# Patient Record
Sex: Female | Born: 1945 | Race: Black or African American | Hispanic: No | Marital: Married | Smoking: Never smoker
Health system: Southern US, Community
[De-identification: ages and names within clinical notes are randomized; demographics above are authoritative.]

## PROBLEM LIST (undated history)

## (undated) DIAGNOSIS — E079 Disorder of thyroid, unspecified: Secondary | ICD-10-CM

## (undated) DIAGNOSIS — G309 Alzheimer's disease, unspecified: Secondary | ICD-10-CM

## (undated) DIAGNOSIS — E119 Type 2 diabetes mellitus without complications: Secondary | ICD-10-CM

## (undated) DIAGNOSIS — F0280 Dementia in other diseases classified elsewhere without behavioral disturbance: Secondary | ICD-10-CM

## (undated) DIAGNOSIS — I10 Essential (primary) hypertension: Secondary | ICD-10-CM

## (undated) DIAGNOSIS — F410 Panic disorder [episodic paroxysmal anxiety] without agoraphobia: Secondary | ICD-10-CM

## (undated) HISTORY — PX: THYROIDECTOMY: SHX17

## (undated) HISTORY — PX: CHOLECYSTECTOMY: SHX55

---

## 2008-10-28 ENCOUNTER — Emergency Department (HOSPITAL_BASED_OUTPATIENT_CLINIC_OR_DEPARTMENT_OTHER): Admission: EM | Admit: 2008-10-28 | Discharge: 2008-10-29 | Payer: Self-pay | Admitting: Emergency Medicine

## 2008-10-29 ENCOUNTER — Ambulatory Visit: Payer: Self-pay | Admitting: Diagnostic Radiology

## 2010-06-14 LAB — BASIC METABOLIC PANEL
BUN: 13 mg/dL (ref 6–23)
CO2: 30 mEq/L (ref 19–32)
Chloride: 102 mEq/L (ref 96–112)
Potassium: 4 mEq/L (ref 3.5–5.1)

## 2010-06-14 LAB — D-DIMER, QUANTITATIVE: D-Dimer, Quant: <0.22 ug/mL-FEU (ref 0.00–0.48)

## 2012-10-08 ENCOUNTER — Emergency Department (HOSPITAL_BASED_OUTPATIENT_CLINIC_OR_DEPARTMENT_OTHER)
Admission: EM | Admit: 2012-10-08 | Discharge: 2012-10-08 | Disposition: A | Discharge status: Home / Self Care | Payer: Medicare PPO | Attending: Emergency Medicine | Admitting: Emergency Medicine

## 2012-10-08 ENCOUNTER — Encounter (HOSPITAL_BASED_OUTPATIENT_CLINIC_OR_DEPARTMENT_OTHER): Payer: Self-pay | Admitting: *Deleted

## 2012-10-08 ENCOUNTER — Emergency Department (HOSPITAL_BASED_OUTPATIENT_CLINIC_OR_DEPARTMENT_OTHER): Payer: Medicare PPO

## 2012-10-08 DIAGNOSIS — R0789 Other chest pain: Secondary | ICD-10-CM

## 2012-10-08 DIAGNOSIS — Z880 Allergy status to penicillin: Secondary | ICD-10-CM | POA: Insufficient documentation

## 2012-10-08 DIAGNOSIS — I10 Essential (primary) hypertension: Secondary | ICD-10-CM | POA: Insufficient documentation

## 2012-10-08 DIAGNOSIS — Z7982 Long term (current) use of aspirin: Secondary | ICD-10-CM | POA: Insufficient documentation

## 2012-10-08 DIAGNOSIS — R071 Chest pain on breathing: Secondary | ICD-10-CM | POA: Insufficient documentation

## 2012-10-08 DIAGNOSIS — E119 Type 2 diabetes mellitus without complications: Secondary | ICD-10-CM | POA: Insufficient documentation

## 2012-10-08 DIAGNOSIS — E079 Disorder of thyroid, unspecified: Secondary | ICD-10-CM | POA: Insufficient documentation

## 2012-10-08 DIAGNOSIS — Z79899 Other long term (current) drug therapy: Secondary | ICD-10-CM | POA: Insufficient documentation

## 2012-10-08 HISTORY — DX: Type 2 diabetes mellitus without complications: E11.9

## 2012-10-08 HISTORY — DX: Essential (primary) hypertension: I10

## 2012-10-08 HISTORY — DX: Disorder of thyroid, unspecified: E07.9

## 2012-10-08 NOTE — ED Provider Notes (Addendum)
CSN: [id number]     Arrival date & time 10/08/12  1836 History     First MD Initiated Contact with Patient 10/08/12 1844     Chief Complaint  Patient presents with  . Back Pain   (Consider location/radiation/quality/duration/timing/severity/associated sxs/prior Treatment) HPI Complains of midthoracic back pain radiating around to right chest to under right breast onset yesterday. Pain is worse with changing position or deep inspiration. No fever no cough pain gradual onset pain is improved with remaining still. No rash no fever no shortness of breath no other associated symptoms she treated herself with half of a hydrocodone-apap this afternoon, without relief. Pain is moderate at present. Past Medical History  Diagnosis Date  . Diabetes mellitus without complication   . Hypertension   . Thyroid disease    Past Surgical History  Procedure Laterality Date  . Thyroidectomy    . Cholecystectomy     No family history on file. History  Substance Use Topics  . Smoking status: Never Smoker   . Smokeless tobacco: Not on file  . Alcohol Use: No   OB History   Grav Para Term Preterm Abortions TAB SAB Ect Mult Living                 Review of Systems  Cardiovascular: Positive for chest pain.  Musculoskeletal: Positive for back pain.  All other systems reviewed and are negative.    Allergies  Celebrex; Lisinopril; Meloxicam; and Penicillins  Home Medications   Current Outpatient Rx  Name  Route  Sig  Dispense  Refill  . amLODipine (NORVASC) 10 MG tablet   Oral   Take 10 mg by mouth daily.         Marland Kitchen aspirin 81 MG tablet   Oral   Take 81 mg by mouth daily.         Marland Kitchen levothyroxine (SYNTHROID, LEVOTHROID) 137 MCG tablet   Oral   Take 137 mcg by mouth daily before breakfast.         . losartan (COZAAR) 25 MG tablet   Oral   Take 25 mg by mouth daily.         . metFORMIN (GLUCOPHAGE) 1000 MG tablet   Oral   Take 1,000 mg by mouth 2 (two) times daily with a  meal.         . Multiple Vitamin (MULTIVITAMIN) tablet   Oral   Take 1 tablet by mouth daily.          BP 145/81  Pulse 67  Temp(Src) 98.3 F (36.8 C) (Oral)  Resp 18  Wt 198 lb (89.812 kg)  SpO2 100% Physical Exam  Nursing note and vitals reviewed. Constitutional: She appears well-developed and well-nourished.  HENT:  Head: Normocephalic and atraumatic.  Eyes: Conjunctivae are normal. Pupils are equal, round, and reactive to light.  Neck: Neck supple. No tracheal deviation present. No thyromegaly present.  Cardiovascular: Normal rate and regular rhythm.   No murmur heard. Pulmonary/Chest: Effort normal and breath sounds normal.  Abdominal: Soft. Bowel sounds are normal. She exhibits no distension. There is no tenderness.  Musculoskeletal: Normal range of motion. She exhibits no edema and no tenderness.  Entire spine nontender. Pain is exacerbated when she sits up from a supine position or when she twists her torso to the left  Neurological: She is alert. Coordination normal.  Skin: Skin is warm and dry. No rash noted.  Psychiatric: She has a normal mood and affect.    ED Course  Procedures (including critical care time)  Labs Reviewed - No data to display No results found. No diagnosis found. Patient took 1 of her own Norco tablets here 7:32 PM  845 p.m. pain improved X-ray viewed by me Results for orders placed during the hospital encounter of 10/28/08  BASIC METABOLIC PANEL      Result Value Range   Sodium 140  135 - 145 mEq/L   Potassium 4.0  3.5 - 5.1 mEq/L   Chloride 102  96 - 112 mEq/L   CO2 30  19 - 32 mEq/L   Glucose, Bld 105 (*) 70 - 99 mg/dL   BUN 13  6 - 23 mg/dL   Creatinine, Ser .9  0.4 - 1.2 mg/dL   Calcium 9.1  8.4 - 78.2 mg/dL   GFR calc non Af Amer >60  >60 mL/min   GFR calc Af Amer    >60 mL/min   Value: >60            The eGFR has been calculated     using the MDRD equation.     This calculation has not been     validated in all  clinical     situations.     eGFR's persistently     <60 mL/min signify     possible Chronic Kidney Disease.  D-DIMER, QUANTITATIVE      Result Value Range   D-Dimer, Quant    0.00 - 0.48 ug/mL-FEU   Value: <0.22            AT THE INHOUSE ESTABLISHED CUTOFF     VALUE OF 0.48 ug/mL FEU,     THIS ASSAY HAS BEEN DOCUMENTED     IN THE LITERATURE TO HAVE     A SENSITIVITY AND NEGATIVE     PREDICTIVE VALUE OF AT LEAST     98 TO 99%.  THE TEST RESULT     SHOULD BE CORRELATED WITH     AN ASSESSMENT OF THE CLINICAL     PROBABILITY OF DVT / VTE.   Dg Chest 2 View  10/08/2012   *RADIOLOGY REPORT*  Clinical Data: Chronic upper back pain, right chest pain, shortness of breath  CHEST - 2 VIEW  Comparison: 10/29/2008  Findings: Lungs are essentially clear.  No focal consolidation.  No pleural effusion or pneumothorax.  Mild cardiomegaly.  Mild degenerative changes of the visualized thoracolumbar spine.  IMPRESSION: No evidence of acute cardiopulmonary disease.   Original Report Authenticated By: Charline Bills, M.D.    MDM  Pain felt to be musculoskeltal in etiology, strongly doubt pulmonary embolism i.e. no shortness of breath pain is worse with moving or changing position no shortness of breath  Plan she may increasehydrocodone -apap. 2 tablets every 6 hours as needed for pain Followup with Dr.Cabeza if continued to have significant pain in 3 or 4 days Diagnosis chest wall pain  .  Doug Sou, MD 10/08/12 [id number]  Doug Sou, MD 10/09/12 [phone]

## 2012-10-08 NOTE — ED Notes (Signed)
Pain under right shoulder that radiates to the right side, started yesterday. Denies N/V

## 2013-05-31 ENCOUNTER — Encounter (HOSPITAL_BASED_OUTPATIENT_CLINIC_OR_DEPARTMENT_OTHER): Payer: Self-pay | Admitting: Emergency Medicine

## 2013-05-31 ENCOUNTER — Emergency Department (HOSPITAL_BASED_OUTPATIENT_CLINIC_OR_DEPARTMENT_OTHER)
Admission: EM | Admit: 2013-05-31 | Discharge: 2013-05-31 | Disposition: A | Discharge status: Home / Self Care | Payer: Medicare PPO | Attending: Emergency Medicine | Admitting: Emergency Medicine

## 2013-05-31 ENCOUNTER — Emergency Department (HOSPITAL_BASED_OUTPATIENT_CLINIC_OR_DEPARTMENT_OTHER): Payer: Medicare PPO

## 2013-05-31 DIAGNOSIS — G8929 Other chronic pain: Secondary | ICD-10-CM | POA: Insufficient documentation

## 2013-05-31 DIAGNOSIS — M549 Dorsalgia, unspecified: Secondary | ICD-10-CM

## 2013-05-31 DIAGNOSIS — M5412 Radiculopathy, cervical region: Secondary | ICD-10-CM

## 2013-05-31 DIAGNOSIS — Z880 Allergy status to penicillin: Secondary | ICD-10-CM | POA: Insufficient documentation

## 2013-05-31 DIAGNOSIS — I10 Essential (primary) hypertension: Secondary | ICD-10-CM | POA: Insufficient documentation

## 2013-05-31 DIAGNOSIS — E079 Disorder of thyroid, unspecified: Secondary | ICD-10-CM | POA: Insufficient documentation

## 2013-05-31 DIAGNOSIS — E119 Type 2 diabetes mellitus without complications: Secondary | ICD-10-CM | POA: Insufficient documentation

## 2013-05-31 DIAGNOSIS — Z79899 Other long term (current) drug therapy: Secondary | ICD-10-CM | POA: Insufficient documentation

## 2013-05-31 DIAGNOSIS — Z7982 Long term (current) use of aspirin: Secondary | ICD-10-CM | POA: Insufficient documentation

## 2013-05-31 LAB — CBC WITH DIFFERENTIAL/PLATELET
BASOS ABS: 0.1 10*3/uL (ref 0.0–0.1)
BASOS PCT: 1 % (ref 0–1)
EOS ABS: 0.2 10*3/uL (ref 0.0–0.7)
Eosinophils Relative: 3 % (ref 0–5)
HCT: 34.9 % — ABNORMAL LOW (ref 36.0–46.0)
Hemoglobin: 11.4 g/dL — ABNORMAL LOW (ref 12.0–15.0)
LYMPHS ABS: 1.4 10*3/uL (ref 0.7–4.0)
Lymphocytes Relative: 25 % (ref 12–46)
MCH: 29.6 pg (ref 26.0–34.0)
MCHC: 32.7 g/dL (ref 30.0–36.0)
MCV: 90.6 fL (ref 78.0–100.0)
Monocytes Absolute: 0.5 10*3/uL (ref 0.1–1.0)
Monocytes Relative: 9 % (ref 3–12)
NEUTROS PCT: 62 % (ref 43–77)
Neutro Abs: 3.5 10*3/uL (ref 1.7–7.7)
PLATELETS: 258 10*3/uL (ref 150–400)
RBC: 3.85 MIL/uL — AB (ref 3.87–5.11)
RDW: 12.8 % (ref 11.5–15.5)
WBC: 5.7 10*3/uL (ref 4.0–10.5)

## 2013-05-31 LAB — BASIC METABOLIC PANEL
BUN: 11 mg/dL (ref 6–23)
CALCIUM: 9.4 mg/dL (ref 8.4–10.5)
CO2: 29 mEq/L (ref 19–32)
Chloride: 99 mEq/L (ref 96–112)
Creatinine, Ser: 0.8 mg/dL (ref 0.50–1.10)
GFR, EST AFRICAN AMERICAN: 85 mL/min — AB (ref >90)
GFR, EST NON AFRICAN AMERICAN: 74 mL/min — AB (ref >90)
GLUCOSE: 256 mg/dL — AB (ref 70–99)
POTASSIUM: 4.1 meq/L (ref 3.7–5.3)
SODIUM: 139 meq/L (ref 137–147)

## 2013-05-31 LAB — TROPONIN I

## 2013-05-31 MED ORDER — NAPROXEN 375 MG PO TABS: 375.0000 mg | ORAL_TABLET | Freq: Two times a day (BID) | ORAL | Status: DC

## 2013-05-31 MED ORDER — HYDROCODONE-ACETAMINOPHEN 5-325 MG PO TABS: 2.0000 | ORAL_TABLET | ORAL | Status: DC | PRN

## 2013-05-31 NOTE — ED Notes (Signed)
Pt reports sharp pain under left scapula radiating to left arm intermittently x 1 week. Denies injury.

## 2013-05-31 NOTE — ED Notes (Signed)
Patient transported to X-ray 

## 2013-05-31 NOTE — Discharge Instructions (Signed)
Followup with your doctor in the office as soon as possible for a recheck. Return to the ER for worsening pain, shortness of breath or any chest pain.  Back Pain, Adult Low back pain is very common. About 1 in 5 people have back pain.The cause of low back pain is rarely dangerous. The pain often gets better over time.About half of people with a sudden onset of back pain feel better in just 2 weeks. About 8 in 10 people feel better by 6 weeks.  CAUSES Some common causes of back pain include:  Strain of the muscles or ligaments supporting the spine.  Wear and tear (degeneration) of the spinal discs.  Arthritis.  Direct injury to the back. DIAGNOSIS Most of the time, the direct cause of low back pain is not known.However, back pain can be treated effectively even when the exact cause of the pain is unknown.Answering your caregiver's questions about your overall health and symptoms is one of the most accurate ways to make sure the cause of your pain is not dangerous. If your caregiver needs more information, he or she may order lab work or imaging tests (X-rays or MRIs).However, even if imaging tests show changes in your back, this usually does not require surgery. HOME CARE INSTRUCTIONS For many people, back pain returns.Since low back pain is rarely dangerous, it is often a condition that people can learn to [facility] their own.   Remain active. It is stressful on the back to sit or stand in one place. Do not sit, drive, or stand in one place for more than 30 minutes at a time. Take short walks on level surfaces as soon as pain allows.Try to increase the length of time you walk each day.  Do not stay in bed.Resting more than 1 or 2 days can delay your recovery.  Do not avoid exercise or work.Your body is made to move.It is not dangerous to be active, even though your back may hurt.Your back will likely heal faster if you return to being active before your pain is gone.  Pay  attention to your body when you bend and lift. Many people have less discomfortwhen lifting if they bend their knees, keep the load close to their bodies,and avoid twisting. Often, the most comfortable positions are those that put less stress on your recovering back.  Find a comfortable position to sleep. Use a firm mattress and lie on your side with your knees slightly bent. If you lie on your back, put a pillow under your knees.  Only take over-the-counter or prescription medicines as directed by your caregiver. Over-the-counter medicines to reduce pain and inflammation are often the most helpful.Your caregiver may prescribe muscle relaxant drugs.These medicines help dull your pain so you can more quickly return to your normal activities and healthy exercise.  Put ice on the injured area.  Put ice in a plastic bag.  Place a towel between your skin and the bag.  Leave the ice on for 15-20 minutes, 03-04 times a day for the first 2 to 3 days. After that, ice and heat may be alternated to reduce pain and spasms.  Ask your caregiver about trying back exercises and gentle massage. This may be of some benefit.  Avoid feeling anxious or stressed.Stress increases muscle tension and can worsen back pain.It is important to recognize when you are anxious or stressed and learn ways to manage it.Exercise is a great option. SEEK MEDICAL CARE IF:  You have pain that is not  relieved with rest or medicine.  You have pain that does not improve in 1 week.  You have new symptoms.  You are generally not feeling well. SEEK IMMEDIATE MEDICAL CARE IF:   You have pain that radiates from your back into your legs.  You develop new bowel or bladder control problems.  You have unusual weakness or numbness in your arms or legs.  You develop nausea or vomiting.  You develop abdominal pain.  You feel faint. Document Released: 02/23/2005 Document Revised: 08/25/2011 Document Reviewed:  07/14/2010 [facility] Patient Information 2014 [place], [place].  Cervical Radiculopathy Cervical radiculopathy means a nerve in the neck is pinched or bruised. This can cause pain or loss of feeling (numbness) that runs from your neck to your arm and fingers. HOME CARE   Put ice on the injured or painful area.  Put ice in a plastic bag.  Place a towel between your skin and the bag.  Leave the ice on for 15-20 minutes, 03-04 times a day, or as told by your doctor.  If ice does not help, you can try using heat. Take a warm shower or bath, or use a hot water bottle as told by your doctor.  You may try a gentle neck and shoulder massage.  Use a flat pillow when you sleep.  Only take medicines as told by your doctor.  Keep all physical therapy visits as told by your doctor.  If you are given a soft collar, wear it as told by your doctor. GET HELP RIGHT AWAY IF:   Your pain gets worse and is not controlled with medicine.  You lose feeling or feel weak in your hand, arm, face, or leg.  You have a fever or stiff neck.  You cannot control when you poop or pee (incontinence).  You have trouble with walking, balance, or speaking. MAKE SURE YOU:   Understand these instructions.  Will watch your condition.  Will get help right away if you are not doing well or get worse. Document Released: 02/12/2011 Document Revised: 05/18/2011 Document Reviewed: 02/12/2011 [facility] Patient Information 2014 [place], [place].   Emergency Department Resource Guide 1) Find a Doctor and Pay Out of Pocket Although you won't have to find out who is covered by your insurance plan, it is a good idea to ask around and get recommendations. You will then need to call the office and see if the doctor you have chosen will accept you as a new patient and what types of options they offer for patients who are self-pay. Some doctors offer discounts or will set up payment plans for their patients who do not have  insurance, but you will need to ask so you aren't surprised when you get to your appointment.  2) Contact Your Local Health Department Not all health departments have doctors that can see patients for sick visits, but many do, so it is worth a call to see if yours does. If you don't know where your local health department is, you can check in your phone book. The CDC also has a tool to help you locate your state's health department, and many state websites also have listings of all of their local health departments.  3) Find a Walk-in Clinic If your illness is not likely to be very severe or complicated, you may want to try a walk in clinic. These are popping up all over the country in pharmacies, drugstores, and shopping centers. They're usually staffed by nurse practitioners or physician assistants  that have been trained to treat common illnesses and complaints. They're usually fairly quick and inexpensive. However, if you have serious medical issues or chronic medical problems, these are probably not your best option.  No Primary Care Doctor: - Call Health Connect at  [phone] - they can help you locate a primary care doctor that  accepts your insurance, provides certain services, etc. - Physician Referral Service- [phone]  Chronic Pain Problems: Organization         Address  Phone   Notes  [facility]  [phone] Patients need to be referred by their primary care doctor.   Medication Assistance: Organization         Address  Phone   Notes  [facility] Medication [facility] [address] [place], [place] [address] [phone] --Must be a resident of [place] -- Must have NO insurance coverage whatsoever (no Medicaid/ Medicare, etc.) -- The pt. MUST have a primary care doctor that directs their care regularly and follows them in the community   [facility]  [phone]   [employer]  [phone]    Agencies that provide  inexpensive medical care: Organization         Address  Phone   Notes  [facility]  [phone]   [facility]    [phone]   [facility] [address] [place], [place] [address] [phone]   [facility] [address] [place] [place] [phone]   Planned Parenthood    [phone]   [facility]    [phone]   [facility]  [address], [place] Phone:  [phone], Fax:  [phone] Hours of Operation:  9 am - 6 pm, M-F.  Also accepts Medicaid/Medicare and self-pay.  [facility]  [address], [place] Phone: [phone], Fax: [phone]. Hours of Operation:  8:30 am - 5:30 pm, M-F.  Also accepts Medicaid and self-pay.  [facility] [place] [address], [place] Phone: [phone]   [facility] [address] Natasha Bence [place], [place] [phone], Ext. [phone] Mondays & Thursdays: 7-9 AM.  First 15 patients are seen on a first come, first serve basis.    Medicaid-accepting [facility] Providers:  Organization         Address  Phone   Notes  [facility] [address], [place] [phone] Also accepts self-pay patients.  [facility] [address] Laurell Josephs [place], [place]  [phone]   [facility] [address], [place] [phone]   [facility] [address] [place] [phone]   Renaye Rakers [address], [place]   [phone] Only accepts [place] patients after they have their name applied to their card.   Self-Pay (no insurance) in [facility]:  Organization         Address  Phone   Notes  Sickle Cell Patients, [facility] Internal Medicine [address] [place], [place] [phone]   [facility] [address] [place], [place] [phone]   [facility] [place]  [address], [place] [phone]   [facility]/Dr. Osei-Bonsu  [address] [place] [address],  [facility] or [address], [place] [phone] Phone number for both [place] and [place] locations is the same.  [facility] [address], [place] [phone]   [facility] [place] [address] [place] [address], [place] or [address] [phone]   [facility] [address], [place] [phone], phone; [phone], fax Sees patients 1st and 3rd Saturday of every month.  Must not qualify for public or private insurance (i.e. Medicaid, Medicare, [place] Health Choice, Veterans' Benefits)  Household income should be no more than 200% of the poverty level The clinic cannot treat you if you are pregnant or think you are pregnant  Sexually transmitted diseases are not treated at the clinic.    Dental Care: Organization         Address  Phone  Notes  [facility] [facility] [address], [place] [phone] Accepts children up to age 70 who are enrolled in [place] or [place] Health Choice; pregnant women with a Medicaid card; and children who have applied for Medicaid or [place] Health Choice, but were declined, whose parents can pay a reduced fee at time of service.  [facility] [place]  [address], [place] [phone] Accepts children up to age 10821 who are enrolled in [place] or [place] Health Choice; pregnant women with a Medicaid card; and children who have applied for Medicaid or [place] Health Choice, but were declined, whose parents can pay a reduced fee at time of service.  [facility]  [address], [place] [phone] Patients are seen by appointment only. Walk-ins are  not accepted. [facility] will see patients 70 years of age and older. Monday - Tuesday (8am-5pm) Most Wednesdays (8:30-5pm) $30 per visit, cash only  [facility]  [address], [place] [place] [phone] Patients are seen by appointment only. Walk-ins are not accepted. [facility] will see patients 70 years of age and older. One Wednesday Evening (Monthly: Volunteer Based).  $30 per visit, cash only  [employer] [facility]  [phone] for adults; Children under age 354, call Graduate Pediatric Dentistry at [phone]. Children aged 544-14, please call [phone] to request a pediatric application.  Dental services are provided in all areas of dental care including fillings, crowns and bridges, complete and partial dentures, implants, gum treatment, root canals, and extractions. Preventive care is also provided. Treatment is provided to both adults and children. Patients are selected via a lottery and there is often a waiting list.   [facility] [address], [place]  [phone] [email or web address]   [facility] [address], [place], [place] [phone], Ext. [phone] Second and Fourth Thursday of each month, opens at 6:30 AM; Clinic ends at 9 AM.  Patients are seen on a first-come first-served basis, and a limited number are seen during each clinic.   [facility]  [address], [place], [place] [phone]   Eligibility Requirements You must have lived in [place], [place], or [place] counties for at least the last three months.   You cannot be eligible for state or federal sponsored [place]healthcare insurance, including CIG[employer]Veterans Administration, [place]icaid, or [employer]icare.   You generally cannot be eligible for healthcare insurance through your employer.    How to apply: Eligibility  screenings are held every Tuesday and Wednesday afternoon from 1:00 pm until 4:00 pm. You do not need an appointment for  the interview!  [facility] [address] [place], [place] [phone]   [facility]  [phone]   [facility]  [phone]   [facility]  [phone]    Behavioral Health Resources in the Community: Intensive Outpatient Programs Organization         Address  Phone  Notes  [facility] Services [address] [place], [place] [phone]   [facility] Outpatient [address], [place], [place] [phone]   [facility] [address], [place], [place]  [phone]   [facility] Mental Health [address],  [place], [place] [phone] or [phone]   Substance Abuse Resources Organization         Address  Phone  Notes  [facility] Drug Services  [phone]   [facility]  [phone]   [facility] [place]  [phone]   Floydene Flock  [phone]   Residential & Outpatient Substance Abuse Program  [phone]   Psychological Services Organization         Address  Phone  Notes  [facility] Behavioral Health  [phone]   [facility]  [phone]   [facility] Mental Health [address] [place] [phone] or [phone]    Mobile Crisis Teams Organization         Address  Phone  Notes  [facility], Mobile Crisis Care Unit  [phone]   [facility]  [address] [place], [place] [phone]   Doristine Locks [address] [place] [phone]    Self-Help/Support Groups Organization         Address  Phone             Notes  [facility] [place] - variety of support groups  [phone]- I7437963 Call for more information  Narcotics Anonymous (NA), [facility]aring [facility] [address], [employer] [place]  2 meetings at this location   Statistician         Address  Phone  Notes  [facility]  [address],    [place]  [phone]   [facility]  [address], [place] [address], [place], [place] [phone]   [facility] [address] [place], [place] [phone] Admissions: 8am-3pm M-F  [facility] [address],    [place], [place] [phone]   [facility] [address] [place], [place], [place] [phone]   [facility] [address]  [place], [place] [phone]   [facility] - Intensive Outpatient [address] Laurell Josephs [address], [place], [place] [phone]   [facility] ([facility]iction [facility] [address] [place].,  [place], [place] [phone] or [phone]   [facility] ([facility]) [address], [place], [place] [phone] Accepts Medicaid  [facility] [address],  [place] [phone] Substance Abuse/Addiction Treatment   [facility] Organization         Address  Phone  Notes  [facility]  [phone]   Angie Fava, PhD [address] Ervin Knack [place], [place]   [phone] or [phone]   [facility]   [address] [place], [place] [phone]   [facility] [address], [place], [place] [phone] Insurance/Medicaid/sponsorship through [facility] and Families [address] [place], [place] [phone] [place], [place] [phone]    Dr. Adele Schilder  [phone]   [facility] Dept. 1) [address], [place] 2) [place] 3)  [place] [address], [place] [phone]  [phone]   [place] [phone] or [phone] (After Hours)

## 2013-05-31 NOTE — ED Provider Notes (Signed)
CSN: [id number]     Arrival date & time 05/31/13  4098 History   First MD Initiated Contact with Patient 05/31/13 [phone]     Chief Complaint  Patient presents with  . Back Pain  . Arm Pain     (Consider location/radiation/quality/duration/timing/severity/associated sxs/prior Treatment) HPI Comments: Patient presents to the ER for evaluation of pain in her back. Patient reports that symptoms have been present for one week. It started just below the left scapula as a sharp pain. Since then it has become more of a dull ache and has gone up into the left shoulder area. Today she has had pain going down her left arm. Patient reports a history of chronic neck pain and problems, but no recent injury. She has not had any loss of strength or sensation in the extremity. There is no chest pain or shortness of breath.  Patient is a 68 y.o. female presenting with back pain and arm pain.  Back Pain Associated symptoms: no chest pain   Arm Pain Pertinent negatives include no chest pain and no shortness of breath.    Past Medical History  Diagnosis Date  . Diabetes mellitus without complication   . Hypertension   . Thyroid disease    Past Surgical History  Procedure Laterality Date  . Thyroidectomy    . Cholecystectomy     No family history on file. History  Substance Use Topics  . Smoking status: Never Smoker   . Smokeless tobacco: Not on file  . Alcohol Use: No   OB History   Grav Para Term Preterm Abortions TAB SAB Ect Mult Living                 Review of Systems  Respiratory: Negative for shortness of breath.   Cardiovascular: Negative for chest pain.  Musculoskeletal: Positive for back pain.  All other systems reviewed and are negative.      Allergies  Celebrex; Lisinopril; Meloxicam; and Penicillins  Home Medications   Current Outpatient Rx  Name  Route  Sig  Dispense  Refill  . amLODipine (NORVASC) 10 MG tablet   Oral   Take 10 mg by mouth daily.         Marland Kitchen  aspirin 81 MG tablet   Oral   Take 81 mg by mouth daily.         Marland Kitchen levothyroxine (SYNTHROID, LEVOTHROID) 137 MCG tablet   Oral   Take 137 mcg by mouth daily before breakfast.         . losartan (COZAAR) 25 MG tablet   Oral   Take 25 mg by mouth daily.         . metFORMIN (GLUCOPHAGE) 1000 MG tablet   Oral   Take 1,000 mg by mouth 2 (two) times daily with a meal.         . Multiple Vitamin (MULTIVITAMIN) tablet   Oral   Take 1 tablet by mouth daily.          BP 168/88  Pulse 74  Temp(Src) 98 F (36.7 C) (Oral)  Resp 16  Ht 5\' 11"  (1.803 m)  Wt 200 lb (90.719 kg)  BMI 27.91 kg/m2  SpO2 100% Physical Exam  Constitutional: She is oriented to person, place, and time. She appears well-developed and well-nourished. No distress.  HENT:  Head: Normocephalic and atraumatic.  Right Ear: Hearing normal.  Left Ear: Hearing normal.  Nose: Nose normal.  Mouth/Throat: Oropharynx is clear and moist and mucous membranes are  normal.  Eyes: Conjunctivae and EOM are normal. Pupils are equal, round, and reactive to light.  Neck: Normal range of motion. Neck supple. Muscular tenderness present. No spinous process tenderness present.  Cardiovascular: Regular rhythm, S1 normal and S2 normal.  Exam reveals no gallop and no friction rub.   No murmur heard. Pulmonary/Chest: Effort normal and breath sounds normal. No respiratory distress.   She exhibits tenderness (posterior).  Abdominal: Soft. Normal appearance and bowel sounds are normal. There is no hepatosplenomegaly. There is no tenderness. There is no rebound, no guarding, no tenderness at McBurney's point and negative Murphy's sign. No hernia.  Musculoskeletal: Normal range of motion.       Left shoulder: She exhibits tenderness. She exhibits no bony tenderness and no deformity.       Cervical back: She exhibits no bony tenderness.       Back:       Arms: Neurological: She is alert and oriented to person, place, and time. She  has normal strength. No cranial nerve deficit or sensory deficit. Coordination normal. GCS eye subscore is 4. GCS verbal subscore is 5. GCS motor subscore is 6.  Skin: Skin is warm, dry and intact. No rash noted. No cyanosis.  Psychiatric: She has a normal mood and affect. Her speech is normal and behavior is normal. Thought content normal.    ED Course  Procedures (including critical care time) Labs Review Labs Reviewed  CBC WITH DIFFERENTIAL - Abnormal; Notable for the following:    RBC 3.85 (*)    Hemoglobin 11.4 (*)    HCT 34.9 (*)    All other components within normal limits  BASIC METABOLIC PANEL - Abnormal; Notable for the following:    Glucose, Bld 256 (*)    GFR calc non Af Amer 74 (*)    GFR calc Af Amer 85 (*)    All other components within normal limits  TROPONIN I   Imaging Review Dg Chest 2 View  05/31/2013   CLINICAL DATA:  Pain.  EXAM: CHEST  2 VIEW  COMPARISON:  DG CHEST 2 VIEW dated 10/08/2012  FINDINGS: Mediastinum and hilar structures are normal. Lungs are clear. Borderline cardiomegaly. Heart size is stable. No pleural effusion or pneumothorax. Surgical clips right upper quadrant.  IMPRESSION: Borderline cardiomegaly, no CHF.  No acute cardiopulmonary disease.   Electronically Signed   By: Maisie Fushomas  Register   On: 05/31/2013 11:19   Dg Shoulder Left  05/31/2013   CLINICAL DATA:  Pain.  EXAM: LEFT SHOULDER - 2+ VIEW  COMPARISON:  None.  FINDINGS: There is no evidence of fracture or dislocation. There is no evidence of arthropathy or other focal bone abnormality. Soft tissues are unremarkable.  IMPRESSION: Negative.   Electronically Signed   By: Maisie Fushomas  Register   On: 05/31/2013 11:20     EKG Interpretation   Date/Time:  Wednesday May 31 2013 09:42:05 EDT Ventricular Rate:  53 PR Interval:  166 QRS Duration: 88 QT Interval:  418 QTC Calculation: 392 R Axis:   -21 Text Interpretation:  Sinus bradycardia Minimal voltage criteria for LVH,  may be normal variant  Anterior infarct , age undetermined Abnormal ECG No  previous tracing Confirmed by POLLINA  MD, CHRISTOPHER [phone]([id number]) on  05/31/2013 10:35:57 AM      MDM   Final diagnoses:  None   Patient presents to the ureter but the pain in the left upper back as well as left shoulder and arm. She has not experienced any chest  pain. She does have a history of diabetes and hypertension, consider cardiac risk factors. Symptoms, however, are very atypical. Pain has been continuous for one week. Is not related to exertion. It is reproducible by palpation. Her EKG does not show any acute ischemic or infarct changes. Troponin is negative. Chest x-ray and shoulder x-ray were unremarkable.  Based on this, patient will be treated for musculoskeletal pain and is to followup as an outpatient with primary care. Return to the ER if symptoms worsen.    Gilda Crease, MD 05/31/13 [phone]

## 2013-05-31 NOTE — ED Notes (Signed)
MD at bedside. 

## 2014-01-09 ENCOUNTER — Emergency Department (HOSPITAL_BASED_OUTPATIENT_CLINIC_OR_DEPARTMENT_OTHER): Payer: Medicare PPO

## 2014-01-09 ENCOUNTER — Encounter (HOSPITAL_BASED_OUTPATIENT_CLINIC_OR_DEPARTMENT_OTHER): Payer: Self-pay

## 2014-01-09 ENCOUNTER — Emergency Department (HOSPITAL_BASED_OUTPATIENT_CLINIC_OR_DEPARTMENT_OTHER)
Admission: EM | Admit: 2014-01-09 | Discharge: 2014-01-09 | Disposition: A | Discharge status: Home / Self Care | Payer: Medicare PPO | Attending: Emergency Medicine | Admitting: Emergency Medicine

## 2014-01-09 DIAGNOSIS — Z880 Allergy status to penicillin: Secondary | ICD-10-CM | POA: Insufficient documentation

## 2014-01-09 DIAGNOSIS — E079 Disorder of thyroid, unspecified: Secondary | ICD-10-CM | POA: Insufficient documentation

## 2014-01-09 DIAGNOSIS — Z7982 Long term (current) use of aspirin: Secondary | ICD-10-CM | POA: Insufficient documentation

## 2014-01-09 DIAGNOSIS — I10 Essential (primary) hypertension: Secondary | ICD-10-CM | POA: Diagnosis not present

## 2014-01-09 DIAGNOSIS — E119 Type 2 diabetes mellitus without complications: Secondary | ICD-10-CM | POA: Diagnosis not present

## 2014-01-09 DIAGNOSIS — R079 Chest pain, unspecified: Secondary | ICD-10-CM

## 2014-01-09 DIAGNOSIS — Z79899 Other long term (current) drug therapy: Secondary | ICD-10-CM | POA: Insufficient documentation

## 2014-01-09 LAB — CBC WITH DIFFERENTIAL/PLATELET
BASOS PCT: 1 % (ref 0–1)
Basophils Absolute: 0.1 10*3/uL (ref 0.0–0.1)
EOS ABS: 0.1 10*3/uL (ref 0.0–0.7)
Eosinophils Relative: 1 % (ref 0–5)
HEMATOCRIT: 33.5 % — AB (ref 36.0–46.0)
HEMOGLOBIN: 11.1 g/dL — AB (ref 12.0–15.0)
LYMPHS ABS: 1.8 10*3/uL (ref 0.7–4.0)
Lymphocytes Relative: 29 % (ref 12–46)
MCH: 30.7 pg (ref 26.0–34.0)
MCHC: 33.1 g/dL (ref 30.0–36.0)
MCV: 92.8 fL (ref 78.0–100.0)
MONO ABS: 0.4 10*3/uL (ref 0.1–1.0)
MONOS PCT: 7 % (ref 3–12)
NEUTROS ABS: 3.9 10*3/uL (ref 1.7–7.7)
NEUTROS PCT: 62 % (ref 43–77)
Platelets: 227 10*3/uL (ref 150–400)
RBC: 3.61 MIL/uL — AB (ref 3.87–5.11)
RDW: 12.6 % (ref 11.5–15.5)
WBC: 6.2 10*3/uL (ref 4.0–10.5)

## 2014-01-09 LAB — COMPREHENSIVE METABOLIC PANEL
ALBUMIN: 3.6 g/dL (ref 3.5–5.2)
ALK PHOS: 104 U/L (ref 39–117)
ALT: 22 U/L (ref 0–35)
AST: 19 U/L (ref 0–37)
Anion gap: 11 (ref 5–15)
BUN: 13 mg/dL (ref 6–23)
CALCIUM: 9.3 mg/dL (ref 8.4–10.5)
CO2: 28 mEq/L (ref 19–32)
Chloride: 100 mEq/L (ref 96–112)
Creatinine, Ser: 1.1 mg/dL (ref 0.50–1.10)
GFR calc Af Amer: 58 mL/min — ABNORMAL LOW (ref >90)
GFR calc non Af Amer: 50 mL/min — ABNORMAL LOW (ref >90)
Glucose, Bld: 193 mg/dL — ABNORMAL HIGH (ref 70–99)
POTASSIUM: 4.3 meq/L (ref 3.7–5.3)
SODIUM: 139 meq/L (ref 137–147)
TOTAL PROTEIN: 7.6 g/dL (ref 6.0–8.3)
Total Bilirubin: 0.6 mg/dL (ref 0.3–1.2)

## 2014-01-09 LAB — TROPONIN I: Troponin I: <0.3 ng/mL (ref <0.30)

## 2014-01-09 MED ORDER — IBUPROFEN 600 MG PO TABS: 600.0000 mg | ORAL_TABLET | Freq: Four times a day (QID) | ORAL | Status: DC | PRN

## 2014-01-09 NOTE — ED Provider Notes (Signed)
CSN: [id number]     Arrival date & time 01/09/14  [id number] History   First MD Initiated Contact with Patient 01/09/14 [phone]     Chief Complaint  Patient presents with  . Chest Pain     (Consider location/radiation/quality/duration/timing/severity/associated sxs/prior Treatment) HPI Comments: Patient is a 68 year old female with history of hypertension, diabetes. She presents today with complaints of intermittent, mild, sharp pains to the left chest under her right breast that has been occurring intermittently for the past 3 days. It is not associated with shortness of breath, nausea, diaphoresis, or radiation to the arm or jaw. There is no relation to exertion or food. There are no aggravating or alleviating factors. The pain comes on randomly and results spontaneously, and only lasts for a few seconds at a time when present.  Patient is a 68 y.o. female presenting with chest pain. The history is provided by the patient.  Chest Pain Pain location:  L chest Pain quality: sharp   Pain radiates to:  Does not radiate Pain radiates to the back: no   Pain severity:  Mild Onset quality:  Sudden Duration:  3 days Timing:  Intermittent Progression:  Unchanged Chronicity:  New Context: not breathing, no movement and no trauma   Relieved by:  Nothing Worsened by:  Nothing tried Ineffective treatments:  None tried   Past Medical History  Diagnosis Date  . Diabetes mellitus without complication   . Hypertension   . Thyroid disease    Past Surgical History  Procedure Laterality Date  . Thyroidectomy    . Cholecystectomy     No family history on file. History  Substance Use Topics  . Smoking status: Never Smoker   . Smokeless tobacco: Not on file  . Alcohol Use: No   OB History    No data available     Review of Systems  Cardiovascular: Positive for chest pain.  All other systems reviewed and are negative.     Allergies  Celebrex; Lisinopril; Meloxicam; and Penicillins  Home  Medications   Prior to Admission medications   Medication Sig Start Date End Date Taking? Authorizing Provider  amLODipine (NORVASC) 10 MG tablet Take 10 mg by mouth daily.    Historical Provider, MD  aspirin 81 MG tablet Take 81 mg by mouth daily.    Historical Provider, MD  HYDROcodone-acetaminophen (NORCO/VICODIN) 5-325 MG per tablet Take 2 tablets by mouth every 4 (four) hours as needed for moderate pain. 05/31/13   Gilda Creasehristopher J. Pollina, MD  levothyroxine (SYNTHROID, LEVOTHROID) 137 MCG tablet Take 137 mcg by mouth daily before breakfast.    Historical Provider, MD  losartan (COZAAR) 25 MG tablet Take 25 mg by mouth daily.    Historical Provider, MD  metFORMIN (GLUCOPHAGE) 1000 MG tablet Take 1,000 mg by mouth 2 (two) times daily with a meal.    Historical Provider, MD  Multiple Vitamin (MULTIVITAMIN) tablet Take 1 tablet by mouth daily.    Historical Provider, MD  naproxen (NAPROSYN) 375 MG tablet Take 1 tablet (375 mg total) by mouth 2 (two) times daily. 05/31/13   Gilda Creasehristopher J. Pollina, MD   BP 168/80 mmHg  Pulse 75  Temp(Src) 97.9 F (36.6 C) (Oral)  Resp 18  Ht 5\' 10"  (1.778 m)  Wt 210 lb (95.255 kg)  BMI 30.13 kg/m2  SpO2 100% Physical Exam  Constitutional: She is oriented to person, place, and time. She appears well-developed and well-nourished. No distress.  HENT:  Head: Normocephalic and atraumatic.  Neck: Normal  range of motion. Neck supple.  Cardiovascular: Normal rate and regular rhythm.  Exam reveals no gallop and no friction rub.   No murmur heard. Pulmonary/Chest: Effort normal and breath sounds normal. No respiratory distress. She has no wheezes.  Abdominal: Soft. Bowel sounds are normal. She exhibits no distension. There is no tenderness.  Musculoskeletal: Normal range of motion. She exhibits no edema.  Neurological: She is alert and oriented to person, place, and time.  Skin: Skin is warm and dry. She is not diaphoretic.  Nursing note and vitals  reviewed.   ED Course  Procedures (including critical care time) Labs Review Labs Reviewed  COMPREHENSIVE METABOLIC PANEL  CBC WITH DIFFERENTIAL  TROPONIN I    Imaging Review No results found.   EKG Interpretation   Date/Time:  Tuesday January 09 2014 09:58:03 EST Ventricular Rate:  64 PR Interval:  166 QRS Duration: 92 QT Interval:  414 QTC Calculation: 427 R Axis:   -25 Text Interpretation:  Normal sinus rhythm Anterior infarct , age  undetermined Abnormal ECG no change from 05/31/13 Confirmed by DELOS  MD,  Reyden Smith ([id number]) on 01/09/2014 11:04:40 AM      MDM   Final diagnoses:  None    Patient presents with complaints of intermittent sharp pains under her left breast for the past 2 days. Her symptoms are atypical for cardiac pain and workup is unremarkable. Her EKG shows no changes from priors and troponin is negative. There is nothing on the chest x-ray to explain her discomfort. I suspect this is musculoskeletal and will recommend ibuprofen. She tells me it has been over 5 years since her last stress test and given the presence of risk factors, I feel as though follow-up with cardiology to discuss a stress test would be appropriate. She will be discharged in the meantime and advised to return if her symptoms substantially worsen or change prior to seeing cardiology.    Geoffery Lyonsouglas Ariyan Sinnett, MD 01/09/14 1106

## 2014-01-09 NOTE — ED Notes (Signed)
Sharp intermittent pain under left breast that started 2 days ago.  Denies radiation, SHOB, injury.  Lasts 10-15 minutes. Normal mammogram last week.

## 2014-01-09 NOTE — ED Notes (Signed)
MD at bedside. 

## 2014-01-09 NOTE — Discharge Instructions (Signed)
Ibuprofen 600 mg every 6 hours as needed for pain.  Call [facility] to arrange a follow-up appointment and to discuss a stress test. The contact information has been provided on this discharge summary.  Return to the emergency department if you develop worsening pain, difficulty breathing, or other new and concerning symptoms.   Chest Pain (Nonspecific) It is often hard to give a specific diagnosis for the cause of chest pain. There is always a chance that your pain could be related to something serious, such as a heart attack or a blood clot in the lungs. You need to follow up with your health care provider for further evaluation. CAUSES   Heartburn.  Pneumonia or bronchitis.  Anxiety or stress.  Inflammation around your heart (pericarditis) or lung (pleuritis or pleurisy).  A blood clot in the lung.  A collapsed lung (pneumothorax). It can develop suddenly on its own (spontaneous pneumothorax) or from trauma to the chest.  Shingles infection (herpes zoster virus). The chest wall is composed of bones, muscles, and cartilage. Any of these can be the source of the pain.  The bones can be bruised by injury.  The muscles or cartilage can be strained by coughing or overwork.  The cartilage can be affected by inflammation and become sore (costochondritis). DIAGNOSIS  Lab tests or other studies may be needed to find the cause of your pain. Your health care provider may have you take a test called an ambulatory electrocardiogram (ECG). An ECG records your heartbeat patterns over a 24-hour period. You may also have other tests, such as:  Transthoracic echocardiogram (TTE). During echocardiography, sound waves are used to evaluate how blood flows through your heart.  Transesophageal echocardiogram (TEE).  Cardiac monitoring. This allows your health care provider to monitor your heart rate and rhythm in real time.  Holter monitor. This is a portable device that records your  heartbeat and can help diagnose heart arrhythmias. It allows your health care provider to track your heart activity for several days, if needed.  Stress tests by exercise or by giving medicine that makes the heart beat faster. TREATMENT   Treatment depends on what may be causing your chest pain. Treatment may include:  Acid blockers for heartburn.  Anti-inflammatory medicine.  Pain medicine for inflammatory conditions.  Antibiotics if an infection is present.  You may be advised to change lifestyle habits. This includes stopping smoking and avoiding alcohol, caffeine, and chocolate.  You may be advised to keep your head raised (elevated) when sleeping. This reduces the chance of acid going backward from your stomach into your esophagus. Most of the time, nonspecific chest pain will improve within 2-3 days with rest and mild pain medicine.  HOME CARE INSTRUCTIONS   If antibiotics were prescribed, take them as directed. Finish them even if you start to feel better.  For the next few days, avoid physical activities that bring on chest pain. Continue physical activities as directed.  Do not use any tobacco products, including cigarettes, chewing tobacco, or electronic cigarettes.  Avoid drinking alcohol.  Only take medicine as directed by your health care provider.  Follow your health care provider's suggestions for further testing if your chest pain does not go away.  Keep any follow-up appointments you made. If you do not go to an appointment, you could develop lasting (chronic) problems with pain. If there is any problem keeping an appointment, call to reschedule. SEEK MEDICAL CARE IF:   Your chest pain does not go away, even  after treatment.  You have a rash with blisters on your chest.  You have a fever. SEEK IMMEDIATE MEDICAL CARE IF:   You have increased chest pain or pain that spreads to your arm, neck, jaw, back, or abdomen.  You have shortness of breath.  You have  an increasing cough, or you cough up blood.  You have severe back or abdominal pain.  You feel nauseous or vomit.  You have severe weakness.  You faint.  You have chills. This is an emergency. Do not wait to see if the pain will go away. Get medical help at once. Call your local emergency services (911 in [place] Do not drive yourself to the hospital. MAKE SURE YOU:   Understand these instructions.  Will watch your condition.  Will get help right away if you are not doing well or get worse. Document Released: 12/03/2004 Document Revised: 02/28/2013 Document Reviewed: 09/29/2007 [facility] Patient Information 2015 [employer] [place], [place]. This information is not intended to replace advice given to you by your health care provider. Make sure you discuss any questions you have with your health care provider.

## 2014-05-31 ENCOUNTER — Encounter (HOSPITAL_BASED_OUTPATIENT_CLINIC_OR_DEPARTMENT_OTHER): Payer: Self-pay | Admitting: *Deleted

## 2014-05-31 ENCOUNTER — Emergency Department (HOSPITAL_BASED_OUTPATIENT_CLINIC_OR_DEPARTMENT_OTHER)
Admission: EM | Admit: 2014-05-31 | Discharge: 2014-05-31 | Disposition: A | Discharge status: Home / Self Care | Payer: Medicare PPO | Attending: Emergency Medicine | Admitting: Emergency Medicine

## 2014-05-31 ENCOUNTER — Emergency Department (HOSPITAL_BASED_OUTPATIENT_CLINIC_OR_DEPARTMENT_OTHER): Payer: Medicare PPO

## 2014-05-31 DIAGNOSIS — Z880 Allergy status to penicillin: Secondary | ICD-10-CM | POA: Diagnosis not present

## 2014-05-31 DIAGNOSIS — E079 Disorder of thyroid, unspecified: Secondary | ICD-10-CM | POA: Insufficient documentation

## 2014-05-31 DIAGNOSIS — M25471 Effusion, right ankle: Secondary | ICD-10-CM | POA: Diagnosis not present

## 2014-05-31 DIAGNOSIS — E119 Type 2 diabetes mellitus without complications: Secondary | ICD-10-CM | POA: Diagnosis not present

## 2014-05-31 DIAGNOSIS — Z79899 Other long term (current) drug therapy: Secondary | ICD-10-CM | POA: Diagnosis not present

## 2014-05-31 DIAGNOSIS — Z7982 Long term (current) use of aspirin: Secondary | ICD-10-CM | POA: Insufficient documentation

## 2014-05-31 DIAGNOSIS — M2550 Pain in unspecified joint: Secondary | ICD-10-CM

## 2014-05-31 DIAGNOSIS — M25571 Pain in right ankle and joints of right foot: Secondary | ICD-10-CM | POA: Diagnosis present

## 2014-05-31 DIAGNOSIS — I10 Essential (primary) hypertension: Secondary | ICD-10-CM | POA: Diagnosis not present

## 2014-05-31 MED ORDER — OXYCODONE-ACETAMINOPHEN 5-325 MG PO TABS
1.0000 | ORAL_TABLET | Freq: Once | ORAL | Status: AC
  Administered 2014-05-31: 1 via ORAL
  Filled 2014-05-31: qty 1

## 2014-05-31 MED ORDER — DEXAMETHASONE SODIUM PHOSPHATE 4 MG/ML IJ SOLN
4.0000 mg | Freq: Once | INTRAMUSCULAR | Status: AC
  Administered 2014-05-31: 4 mg via INTRAMUSCULAR
  Filled 2014-05-31: qty 1

## 2014-05-31 MED ORDER — OXYCODONE-ACETAMINOPHEN 5-325 MG PO TABS: 1.0000 | ORAL_TABLET | Freq: Four times a day (QID) | ORAL | Status: DC | PRN

## 2014-05-31 NOTE — Discharge Instructions (Signed)

## 2014-05-31 NOTE — ED Notes (Signed)
MD at bedside. 

## 2014-05-31 NOTE — ED Provider Notes (Signed)
CSN: [id number]     Arrival date & time 05/31/14  1620 History   First MD Initiated Contact with Patient 05/31/14 1658     Chief Complaint  Patient presents with  . Ankle Pain     (Consider location/radiation/quality/duration/timing/severity/associated sxs/prior Treatment) HPI Comments: Patient presents to the ER for evaluation of right ankle pain. Patient reports that she started having pain on the outside portion of her right ankle while walking earlier today. Patient reports the pain is progressively worsened and is now severe. Patient denies any direct injury.  Patient is a 68 y.o. female presenting with ankle pain.  Ankle Pain   Past Medical History  Diagnosis Date  . Diabetes mellitus without complication   . Hypertension   . Thyroid disease    Past Surgical History  Procedure Laterality Date  . Thyroidectomy    . Cholecystectomy     No family history on file. History  Substance Use Topics  . Smoking status: Never Smoker   . Smokeless tobacco: Not on file  . Alcohol Use: No   OB History    No data available     Review of Systems  Musculoskeletal: Positive for arthralgias.  All other systems reviewed and are negative.     Allergies  Celebrex; Lisinopril; Meloxicam; and Penicillins  Home Medications   Prior to Admission medications   Medication Sig Start Date End Date Taking? Authorizing Provider  amLODipine (NORVASC) 10 MG tablet Take 10 mg by mouth daily.    Historical Provider, MD  aspirin 81 MG tablet Take 81 mg by mouth daily.    Historical Provider, MD  HYDROcodone-acetaminophen (NORCO/VICODIN) 5-325 MG per tablet Take 2 tablets by mouth every 4 (four) hours as needed for moderate pain. 05/31/13   Gilda Crease, MD  ibuprofen (ADVIL,MOTRIN) 600 MG tablet Take 1 tablet (600 mg total) by mouth every 6 (six) hours as needed. 01/09/14   Geoffery Lyons, MD  levothyroxine (SYNTHROID, LEVOTHROID) 137 MCG tablet Take 137 mcg by mouth daily before  breakfast.    Historical Provider, MD  losartan (COZAAR) 25 MG tablet Take 25 mg by mouth daily.    Historical Provider, MD  metFORMIN (GLUCOPHAGE) 1000 MG tablet Take 1,000 mg by mouth 2 (two) times daily with a meal.    Historical Provider, MD  Multiple Vitamin (MULTIVITAMIN) tablet Take 1 tablet by mouth daily.    Historical Provider, MD  naproxen (NAPROSYN) 375 MG tablet Take 1 tablet (375 mg total) by mouth 2 (two) times daily. 05/31/13   Gilda Crease, MD   BP 161/83 mmHg  Pulse 82  Temp(Src) 98.5 F (36.9 C) (Oral)  Resp 18  Ht 5' 10.5" (1.791 m)  Wt 203 lb (92.08 kg)  BMI 28.71 kg/m2  SpO2 100% Physical Exam  Constitutional: She is oriented to person, place, and time. She appears well-developed and well-nourished. No distress.  HENT:  Head: Normocephalic and atraumatic.  Right Ear: Hearing normal.  Left Ear: Hearing normal.  Nose: Nose normal.  Mouth/Throat: Oropharynx is clear and moist and mucous membranes are normal.  Eyes: Conjunctivae and EOM are normal. Pupils are equal, round, and reactive to light.  Neck: Normal range of motion. Neck supple.  Cardiovascular: Regular rhythm, S1 normal and S2 normal.  Exam reveals no gallop and no friction rub.   No murmur heard. Pulses:      Dorsalis pedis pulses are 2+ on the right side.  Pulmonary/Chest: Effort normal and breath sounds normal. No respiratory distress. She  exhibits no tenderness.  Abdominal: Soft. Normal appearance and bowel sounds are normal. There is no hepatosplenomegaly. There is no tenderness. There is no rebound, no guarding, no tenderness at McBurney's point and negative Murphy's sign. No hernia.  Musculoskeletal:       Right ankle: She exhibits decreased range of motion and swelling. She exhibits no ecchymosis and no deformity. Tenderness. AITFL tenderness found. Achilles tendon exhibits no pain and no defect.       Right lower leg: She exhibits no tenderness and no swelling.  Neurological: She is  alert and oriented to person, place, and time. She has normal strength. No cranial nerve deficit or sensory deficit. Coordination normal. GCS eye subscore is 4. GCS verbal subscore is 5. GCS motor subscore is 6.  Skin: Skin is warm, dry and intact. No rash noted. No cyanosis.  Psychiatric: She has a normal mood and affect. Her speech is normal and behavior is normal. Thought content normal.  Nursing note and vitals reviewed.   ED Course  Procedures (including critical care time) Labs Review Labs Reviewed - No data to display  Imaging Review Dg Ankle Complete Right  05/31/2014   CLINICAL DATA:  Severe right ankle pain, swelling  EXAM: RIGHT ANKLE - COMPLETE 3+ VIEW  COMPARISON:  None.  FINDINGS: No fracture or dislocation is seen.  The ankle mortise is intact.  The base of the fifth metatarsal is unremarkable.  Mild dorsal soft tissue swelling.  IMPRESSION: No fracture or dislocation is seen.   Electronically Signed   By: Charline BillsSriyesh  Krishnan M.D.   On: 05/31/2014 16:54     EKG Interpretation None      MDM   Final diagnoses:  None  arthralgia  Patient presents to the ER for evaluation of right ankle pain. Patient noticed pain while walking and it has progressively worsened. She has swelling and tenderness in the distal ligamentous region beyond the lateral malleolus of the right ankle. No ecchymosis. No erythema or warmth. Patient has normal blood flow distally. She has normal sensation. X-ray was unremarkable. Because of the pain at this time is unclear, possibly gout or inflammatory. Although no known injury, she does have tenderness over the ligaments, possibly simply a sprain. Patient administered Percocet tablet here with improvement of her pain. She reports that she has a walking boot for that ankle at home. She will use that at home as needed for comfort. Patient does have a history of diabetes, blood sugar is well controlled here. She was given a Decadron 4 mg IM injection in the event  that this is inflammatory, including gout. We'll continue her analgesia, rest, ice and elevation. Follow-up with primary doctor.    Gilda Creasehristopher J Pollina, MD 05/31/14 [phone]

## 2014-05-31 NOTE — ED Notes (Signed)
D/c home with family- rx x 1 given for percocet

## 2014-05-31 NOTE — ED Notes (Signed)
Right ankle pain while walking earlier today. Swelling.

## 2014-05-31 NOTE — ED Notes (Signed)
CBG 132- meter docked by results not yet transferred into epic- Dr Blinda LeatherwoodPollina advised of results

## 2014-06-01 LAB — CBG MONITORING, ED: GLUCOSE-CAPILLARY: 132 mg/dL — AB (ref 70–99)

## 2014-06-15 ENCOUNTER — Encounter (HOSPITAL_BASED_OUTPATIENT_CLINIC_OR_DEPARTMENT_OTHER): Payer: Self-pay | Admitting: *Deleted

## 2014-06-15 ENCOUNTER — Emergency Department (HOSPITAL_BASED_OUTPATIENT_CLINIC_OR_DEPARTMENT_OTHER)
Admission: EM | Admit: 2014-06-15 | Discharge: 2014-06-15 | Disposition: A | Discharge status: Home / Self Care | Payer: Medicare PPO | Attending: Emergency Medicine | Admitting: Emergency Medicine

## 2014-06-15 ENCOUNTER — Emergency Department (HOSPITAL_BASED_OUTPATIENT_CLINIC_OR_DEPARTMENT_OTHER): Payer: Medicare PPO

## 2014-06-15 DIAGNOSIS — Z7982 Long term (current) use of aspirin: Secondary | ICD-10-CM | POA: Diagnosis not present

## 2014-06-15 DIAGNOSIS — M79671 Pain in right foot: Secondary | ICD-10-CM | POA: Diagnosis not present

## 2014-06-15 DIAGNOSIS — Z79899 Other long term (current) drug therapy: Secondary | ICD-10-CM | POA: Diagnosis not present

## 2014-06-15 DIAGNOSIS — I10 Essential (primary) hypertension: Secondary | ICD-10-CM | POA: Diagnosis not present

## 2014-06-15 DIAGNOSIS — E119 Type 2 diabetes mellitus without complications: Secondary | ICD-10-CM | POA: Insufficient documentation

## 2014-06-15 DIAGNOSIS — Z791 Long term (current) use of non-steroidal anti-inflammatories (NSAID): Secondary | ICD-10-CM | POA: Insufficient documentation

## 2014-06-15 DIAGNOSIS — Z880 Allergy status to penicillin: Secondary | ICD-10-CM | POA: Diagnosis not present

## 2014-06-15 DIAGNOSIS — E079 Disorder of thyroid, unspecified: Secondary | ICD-10-CM | POA: Insufficient documentation

## 2014-06-15 NOTE — ED Provider Notes (Signed)
CSN: [id number]     Arrival date & time 06/15/14  1532 History   First MD Initiated Contact with Patient 06/15/14 1548     Chief Complaint  Patient presents with  . Foot Pain     (Consider location/radiation/quality/duration/timing/severity/associated sxs/prior Treatment) Patient is a 69 y.o. female presenting with lower extremity pain. The history is provided by the patient.  Foot Pain This is a new problem. Episode onset: 3 weeks ago. The problem occurs constantly. The problem has not changed since onset.Pertinent negatives include no chest pain, no abdominal pain, no headaches and no shortness of breath. The symptoms are aggravated by walking. Nothing relieves the symptoms. She has tried nothing for the symptoms. The treatment provided no relief.    Past Medical History  Diagnosis Date  . Diabetes mellitus without complication   . Hypertension   . Thyroid disease    Past Surgical History  Procedure Laterality Date  . Thyroidectomy    . Cholecystectomy     No family history on file. History  Substance Use Topics  . Smoking status: Never Smoker   . Smokeless tobacco: Not on file  . Alcohol Use: No   OB History    No data available     Review of Systems  Constitutional: Negative for fever and fatigue.  HENT: Negative for congestion and drooling.   Eyes: Negative for pain.  Respiratory: Negative for cough and shortness of breath.   Cardiovascular: Negative for chest pain.  Gastrointestinal: Negative for nausea, vomiting, abdominal pain and diarrhea.  Genitourinary: Negative for dysuria and hematuria.  Musculoskeletal: Negative for back pain, gait problem and neck pain.  Skin: Negative for color change.  Neurological: Negative for dizziness and headaches.  Hematological: Negative for adenopathy.  Psychiatric/Behavioral: Negative for behavioral problems.  All other systems reviewed and are negative.     Allergies  Celebrex; Lisinopril; Meloxicam; and  Penicillins  Home Medications   Prior to Admission medications   Medication Sig Start Date End Date Taking? Authorizing Provider  amLODipine (NORVASC) 10 MG tablet Take 10 mg by mouth daily.    Historical Provider, MD  aspirin 81 MG tablet Take 81 mg by mouth daily.    Historical Provider, MD  ibuprofen (ADVIL,MOTRIN) 600 MG tablet Take 1 tablet (600 mg total) by mouth every 6 (six) hours as needed. 01/09/14   Geoffery Lyonsouglas Delo, MD  levothyroxine (SYNTHROID, LEVOTHROID) 137 MCG tablet Take 137 mcg by mouth daily before breakfast.    Historical Provider, MD  losartan (COZAAR) 25 MG tablet Take 25 mg by mouth daily.    Historical Provider, MD  metFORMIN (GLUCOPHAGE) 1000 MG tablet Take 1,000 mg by mouth 2 (two) times daily with a meal.    Historical Provider, MD  Multiple Vitamin (MULTIVITAMIN) tablet Take 1 tablet by mouth daily.    Historical Provider, MD  naproxen (NAPROSYN) 375 MG tablet Take 1 tablet (375 mg total) by mouth 2 (two) times daily. 05/31/13   Gilda Creasehristopher J Pollina, MD  oxyCODONE-acetaminophen (PERCOCET) 5-325 MG per tablet Take 1 tablet by mouth every 6 (six) hours as needed. 05/31/14   Gilda Creasehristopher J Pollina, MD   BP 148/74 mmHg  Pulse 84  Temp(Src) 98.6 F (37 C) (Oral)  Resp 18  Ht 5\' 10"  (1.778 m)  Wt 205 lb (92.987 kg)  BMI 29.41 kg/m2  SpO2 100% Physical Exam  Constitutional: She is oriented to person, place, and time. She appears well-developed and well-nourished.  HENT:  Head: Normocephalic and atraumatic.  Mouth/Throat: Oropharynx  is clear and moist. No oropharyngeal exudate.  Eyes: Conjunctivae and EOM are normal. Pupils are equal, round, and reactive to light.  Neck: Normal range of motion. Neck supple.  Cardiovascular: Normal rate, regular rhythm, normal heart sounds and intact distal pulses.  Exam reveals no gallop and no friction rub.   No murmur heard. Pulmonary/Chest: Effort normal and breath sounds normal. No respiratory distress. She has no wheezes.   Abdominal: Soft. Bowel sounds are normal. There is no tenderness. There is no rebound and no guarding.  Musculoskeletal: Normal range of motion. She exhibits tenderness. She exhibits no edema.  Mild dark discoloration to the dorsal lateral aspect of the fifth digit on the right foot. There is a mild callus formation in this area that is tender to palpation.  No erythema or redness noted.  2+ distal DP pulses in the lower extremities.  Normal capillary refill in the lower extremities.  Normal range of motion in the ankles and toes bilaterally.  Neurological: She is alert and oriented to person, place, and time.  Skin: Skin is warm and dry.  Psychiatric: She has a normal mood and affect. Her behavior is normal.  Nursing note and vitals reviewed.   ED Course  Procedures (including critical care time) Labs Review Labs Reviewed - No data to display  Imaging Review Dg Foot Complete Right  06/15/2014   CLINICAL DATA:  Right little toe pain for 2 weeks, no known injury  EXAM: RIGHT FOOT COMPLETE - 3+ VIEW  COMPARISON:  None.  FINDINGS: Three views of the right foot submitted. No acute fracture or subluxation. Mild degenerative changes first metatarsal phalangeal joint. Mild degenerative changes proximal interphalangeal joint fifth toe. There is mild spurring of distal aspect first metatarsal. Small plantar and posterior spur of calcaneus.  IMPRESSION: No acute fracture or subluxation. Degenerative changes as described above.   Electronically Signed   By: Natasha Mead M.D.   On: 06/15/2014 16:34     EKG Interpretation None      MDM   Final diagnoses:  Foot pain, right    3:59 PM 69 y.o. female w hx of DM on metformin who presents with fifth digit pain of the right foot which began about 3 weeks ago. She denies any injuries. She has a small callus there was some mild dark discoloration. Normal capillary refill, 2+ distal pulses in the lower extremities, no evidence of erythema or  infection. Vital signs unremarkable here. We'll get screening plain film and recommend follow-up with a podiatrist.  I interpreted/reviewed the labs and/or imaging which were non-contributory.    Purvis Sheffield, MD 06/15/14 [phone]

## 2014-06-15 NOTE — ED Notes (Signed)
Pt c/o pain in her little toe on her right foot x2-3 weeks without injury.

## 2014-06-18 ENCOUNTER — Ambulatory Visit (INDEPENDENT_AMBULATORY_CARE_PROVIDER_SITE_OTHER): Payer: Medicare PPO | Admitting: Podiatry

## 2014-06-18 ENCOUNTER — Encounter: Payer: Self-pay | Admitting: Podiatry

## 2014-06-18 VITALS — BP 128/74 | HR 66 | Ht 70.5 in | Wt 202.0 lb

## 2014-06-18 DIAGNOSIS — M2041 Other hammer toe(s) (acquired), right foot: Secondary | ICD-10-CM

## 2014-06-18 DIAGNOSIS — M79674 Pain in right toe(s): Secondary | ICD-10-CM

## 2014-06-18 NOTE — Progress Notes (Signed)
Subjective: 69 year old female presents complaining of pain in 5th digit right for the last couple of weeks. Has had pain and swelling in right ankle prior to this episode.  Objective: Digital corn 5th digit right with pain. Contracted lesser digits 4, 5 right. Mild bunion right foot. Pedal pulses are faintly palpable on both feet.  Assessment: Painful hammer toe with digital corn 5th right.  Plan: Reviewed clinical findings and available treatment options. Area debrided and padded. Return as needed.

## 2014-06-18 NOTE — Patient Instructions (Signed)
Seen for pain in 5th toe right.  Noted of build up corn. Debrided and padded. Return as needed.

## 2015-06-04 NOTE — Progress Notes (Signed)
 "  Pulmonary Follow up Note - [facility]  [address], [place] [address] [phone] [address], [place] [address] [phone]     Pulmonary Medicine Outpatient Return Visit    Assessment/Plan:   1. IPF (idiopathic pulmonary fibrosis) (HCC)  Complete Pulmonary Function Test (PFT)    #1 IPF Mild disease mainly limited to the right subdural space with fine inspiratory crackles, functionally excellent lung function based on spirometry 6 months ago FVC greater than 80% She has not had for PFTs, would recommend getting those once a year, otherwise remains asymptomatic and does not require any medications.  RTC 6 months with PFTs  Return in about 6 months (around 12/05/2015).   Deatrice Tisa Campanile MD  [facility]  Diplomate ABIM, Pulmonary, Critical Care and Sleep Medicine  06/04/2015 10:25 AM    Interval History:    History of Present Illness:   70 year old female with IPF here for six-month followup  Last seen by me in September for consultation it was recommended come back in 6 months with PFTs, I did not see PFTs scanned, I checked in our old EMR, based on spirometry she had mild disease, this was diagnosed when she saw Dr. Delilah around August of last year she was taking pain medications also has underlying anxiety for which she occasionally takes anxiolytics, for workup of this pain she had a chest x-ray eventually followed by CT scan for concerns of right lower lobe scarring questionable IPF She had not otherwise  been to pulmonology before, denies any known chronic lung diseases, inquired about previous severe pneumonias especially of the right lung which she cannot recall as such. She had a thyroidectomy in 2013 through ENT and recovered well from it, no known autoimmune processes or joint diseases. When she saw me in the office, She denied any pulmonary  symptoms and therefore was not worried about the CT findings as such. She denies any motor exposures any known history of autoimmune diseases any history of arthritis or night sweats no weight loss  Previous Report Reviewed: historical medical records, lab reports and radiology reports   The following portions of the patient's history were reviewed and updated as appropriate: allergies, current medications, past family history, past medical history, past social history, past surgical history and problem list.   Tobacco status:  reports that she has never smoked. She does not have any smokeless tobacco history on file.  Review of Systems A 14-point ROS was performed with pertinent positives/negatives noted in the HPI. The remainder of the ROS are negative.  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   Impacted cerumen of right ear 02/25/2015   IPF (idiopathic pulmonary fibrosis) (HCC) 02/25/2015   Joint pain, knee 02/25/2015   Lower back pain 02/25/2015   Lung crackles 02/25/2015   Microalbuminuria 02/25/2015   Muscle spasm 02/25/2015   Neck pain 02/25/2015   Discharge from right nipple 02/25/2015   Osteoarthritis 02/25/2015   Degenerative cervical disc 02/25/2015   Overweight 02/25/2015  Pain in joint, multiple sites 02/25/2015   Pure hypercholesterolemia 02/25/2015   Radiculopathy 02/25/2015   Right flank pain 02/25/2015   Type 2 diabetes mellitus with other diabetic kidney complication (HCC) 02/25/2015   Vaginal atrophy  02/25/2015   Vaginal candidiasis 02/25/2015   Resolved Ambulatory Problems    Diagnosis Date Noted   No Resolved Ambulatory Problems   Past Medical History  Diagnosis Date   Nontoxic multinodular goiter    No family history on file. Social History Social History  Substance Use Topics   Smoking status: Never Smoker   Smokeless tobacco: None   Alcohol use None   Social History   Social History Narrative     Medications:   Meds Ordered in [place]  Medication Sig Dispense Refill   acetaminophen -codeine (TYLENOL  #3) 300-30 mg per tablet Take 1 tablet by mouth every 4 (four) hours as needed.     amLODIPine (NORVASC) 10 MG tablet Take 10 mg by mouth daily.     atorvastatin (LIPITOR) 40 MG tablet Take 40 mg by mouth daily.     HYDROcodone -acetaminophen  (NORCO) 5-325 mg per tablet Take 1 tablet by mouth every 6 (six) hours as needed.     levothyroxine (SYNTHROID, LEVOTHROID) 150 MCG tablet Take 150 mcg by mouth Daily at 0600.     losartan (COZAAR) 25 MG tablet Take 25 mg by mouth daily.     oxyCODONE -acetaminophen  (PERCOCET) 5-325 mg per tablet Take 1 tablet by mouth every 4 (four) hours as needed.     sitaGLIPtin-metFORMIN (JANUMET) 50-1,000 mg per tablet Take 1 tablet by mouth 2 times daily with meals.     tiZANidine (ZANAFLEX) 4 MG tablet Take 4 mg by mouth every 6 (six) hours as needed.     No current Epic-ordered facility-administered medications on file.       Physical Exam:   Vitals:   06/04/15 1006  BP: 132/80  Pulse: 69  Resp: 16  SpO2: 98% Comment: RA at rest   General appearance: alert, appears stated age and cooperative  Head: Normocephalic, without obvious abnormality, atraumatic Throat: lips, mucosa, and tongue normal; teeth and gums normal  Neck: no adenopathy, no carotid bruit, no JVD, supple, symmetrical, trachea midline and thyroid  not enlarged, symmetric, no tenderness/mass/nodules Lungs: Auscultation: R basilar fine insp crackles. .  Palpation of chest wall reveals normal fremitus of the thorax. Percussion: Normal  Heart: regular rate and rhythm, S1, S2 normal, no murmur, click, rub or gallop Abdomen: soft, non-tender; bowel sounds normal; no masses,  no organomegaly Extremities: No edema.  Pulses: 2+ and symmetric Skin: Skin color, texture, turgor normal. No rashes or lesions Neurologic: Grossly normal  Diagnostic Review:  I personally reviewed the following: pertinent lab results, imagaing studies, historical clinical notes.  I was able to check and multiple databases including the radiology system ([facility]) , and hospital-based EMR to review all previous medical records to summarize the above. Care provided today involved reviewing multiple databases to make recommendations as above   Risks, benefits, and alternatives of the medication(s) and treatment plan(s) were discussed, and he expressed understanding. Plan follow up as discussed or as needed. No barriers to treatment identified in this visit.   Thank you for allowing me to participate in the care of your patient. Please do not hesitate to contact my office any time if we can be of further assistance   Please note that the note was created using Dragon transcription software, please omit any errors or omissions made inadvertently during  transcription.    Electronically signed by: Deatrice Tisa Campanile, MD 06/04/15 1026 "

## 2015-07-07 ENCOUNTER — Emergency Department (HOSPITAL_BASED_OUTPATIENT_CLINIC_OR_DEPARTMENT_OTHER)
Admission: EM | Admit: 2015-07-07 | Discharge: 2015-07-07 | Disposition: A | Discharge status: Home / Self Care | Payer: Medicare Other | Attending: Emergency Medicine | Admitting: Emergency Medicine

## 2015-07-07 ENCOUNTER — Encounter (HOSPITAL_BASED_OUTPATIENT_CLINIC_OR_DEPARTMENT_OTHER): Payer: Self-pay | Admitting: *Deleted

## 2015-07-07 ENCOUNTER — Emergency Department (HOSPITAL_BASED_OUTPATIENT_CLINIC_OR_DEPARTMENT_OTHER): Payer: Medicare Other

## 2015-07-07 DIAGNOSIS — R0789 Other chest pain: Secondary | ICD-10-CM | POA: Diagnosis not present

## 2015-07-07 DIAGNOSIS — I10 Essential (primary) hypertension: Secondary | ICD-10-CM | POA: Insufficient documentation

## 2015-07-07 DIAGNOSIS — E119 Type 2 diabetes mellitus without complications: Secondary | ICD-10-CM | POA: Insufficient documentation

## 2015-07-07 DIAGNOSIS — Z79899 Other long term (current) drug therapy: Secondary | ICD-10-CM | POA: Diagnosis not present

## 2015-07-07 DIAGNOSIS — Z7984 Long term (current) use of oral hypoglycemic drugs: Secondary | ICD-10-CM | POA: Diagnosis not present

## 2015-07-07 DIAGNOSIS — M549 Dorsalgia, unspecified: Secondary | ICD-10-CM | POA: Diagnosis not present

## 2015-07-07 LAB — BASIC METABOLIC PANEL
ANION GAP: 8 (ref 5–15)
BUN: 15 mg/dL (ref 6–20)
CHLORIDE: 104 mmol/L (ref 101–111)
CO2: 26 mmol/L (ref 22–32)
Calcium: 9.4 mg/dL (ref 8.9–10.3)
Creatinine, Ser: 0.94 mg/dL (ref 0.44–1.00)
GFR calc Af Amer: >60 mL/min (ref >60)
GFR, EST NON AFRICAN AMERICAN: 60 mL/min — AB (ref >60)
GLUCOSE: 282 mg/dL — AB (ref 65–99)
POTASSIUM: 4.1 mmol/L (ref 3.5–5.1)
SODIUM: 138 mmol/L (ref 135–145)

## 2015-07-07 LAB — CBC
HCT: 33.4 % — ABNORMAL LOW (ref 36.0–46.0)
HEMOGLOBIN: 11 g/dL — AB (ref 12.0–15.0)
MCH: 29.5 pg (ref 26.0–34.0)
MCHC: 32.9 g/dL (ref 30.0–36.0)
MCV: 89.5 fL (ref 78.0–100.0)
Platelets: 221 10*3/uL (ref 150–400)
RBC: 3.73 MIL/uL — ABNORMAL LOW (ref 3.87–5.11)
RDW: 12.8 % (ref 11.5–15.5)
WBC: 7.1 10*3/uL (ref 4.0–10.5)

## 2015-07-07 LAB — TROPONIN I: Troponin I: <0.03 ng/mL (ref <0.031)

## 2015-07-07 LAB — D-DIMER, QUANTITATIVE (NOT AT ARMC): D DIMER QUANT: 0.53 ug{FEU}/mL — AB (ref 0.00–0.50)

## 2015-07-07 MED ORDER — HYDROCODONE-ACETAMINOPHEN 5-325 MG PO TABS
1.0000 | ORAL_TABLET | Freq: Once | ORAL | Status: AC
  Administered 2015-07-07: 1 via ORAL
  Filled 2015-07-07: qty 1

## 2015-07-07 MED ORDER — HYDROCODONE-ACETAMINOPHEN 5-325 MG PO TABS: 1.0000 | ORAL_TABLET | ORAL | Status: DC | PRN

## 2015-07-07 NOTE — ED Notes (Signed)
Pt states pain in her left chest awoke her last night, feels like the pain is "traveling" to under the right breast. Pt states she has been out of her bp meds for about a month. Pain worse with inspiration

## 2015-07-07 NOTE — ED Provider Notes (Signed)
CSN: [id number]     Arrival date & time 07/07/15  1947 History  By signing my name below, I, [facility], attest that this documentation has been prepared under the direction and in the presence of Pricilla Loveless, MD. Electronically Signed: Randell Patient, ED Scribe. 07/07/2015. 10:46 PM.   Chief Complaint  Patient presents with  . Chest Pain   The history is provided by the patient. No language interpreter was used.   HPI Comments: Shannon Ballard is a 70 y.o. female with an hx of HTN, DM, and thyroid disease who presents to the Emergency Department complaining of moderate CP under her right breast ongoing for the past week, that worsened from intermittent to constant and traveled from the left to the right side of her chest last night. Patient states that she was sick last week with a completely resolved cough and had intermittent CP that she associated with that complaint. Pain worse with movement and deep inspiration and unchanged by exertion. Denies SOB, nausea, vomiting, diaphoresis, leg swelling, back pain different from baseline, or any other symptoms currently.  Past Medical History  Diagnosis Date  . Diabetes mellitus without complication (HCC)   . Hypertension   . Thyroid disease    Past Surgical History  Procedure Laterality Date  . Thyroidectomy    . Cholecystectomy     No family history on file. Social History  Substance Use Topics  . Smoking status: Never Smoker   . Smokeless tobacco: Never Used  . Alcohol Use: No   OB History    No data available     Review of Systems  Constitutional: Negative for diaphoresis.  Respiratory: Negative for cough and shortness of breath.   Cardiovascular: Positive for chest pain. Negative for leg swelling.  Gastrointestinal: Negative for nausea and vomiting.  Musculoskeletal: Positive for back pain (baseline).  All other systems reviewed and are negative.  Allergies  Celebrex; Lisinopril; Meloxicam; and  Penicillins  Home Medications   Prior to Admission medications   Medication Sig Start Date End Date Taking? Authorizing Provider  amLODipine (NORVASC) 10 MG tablet Take 10 mg by mouth daily.    Historical Provider, MD  aspirin 81 MG tablet Take 81 mg by mouth daily.    Historical Provider, MD  ibuprofen (ADVIL,MOTRIN) 600 MG tablet Take 1 tablet (600 mg total) by mouth every 6 (six) hours as needed. 01/09/14   Geoffery Lyons, MD  levothyroxine (SYNTHROID, LEVOTHROID) 137 MCG tablet Take 137 mcg by mouth daily before breakfast.    Historical Provider, MD  losartan (COZAAR) 25 MG tablet Take 25 mg by mouth daily.    Historical Provider, MD  metFORMIN (GLUCOPHAGE) 1000 MG tablet Take 1,000 mg by mouth 2 (two) times daily with a meal.    Historical Provider, MD  Multiple Vitamin (MULTIVITAMIN) tablet Take 1 tablet by mouth daily.    Historical Provider, MD  naproxen (NAPROSYN) 375 MG tablet Take 1 tablet (375 mg total) by mouth 2 (two) times daily. 05/31/13   Gilda Crease, MD  oxyCODONE-acetaminophen (PERCOCET) 5-325 MG per tablet Take 1 tablet by mouth every 6 (six) hours as needed. 05/31/14   Gilda Crease, MD   BP 169/91 mmHg  Pulse 48  Temp(Src) 98.7 F (37.1 C) (Oral)  Resp 13  Ht  (1.778 m)  Wt 198 lb (89.812 kg)  BMI 28.41 kg/m2  SpO2 100% Physical Exam  Constitutional: She is oriented to person, place, and time. She appears well-developed and well-nourished.  HENT:  Head: Normocephalic and atraumatic.  Right Ear: External ear normal.  Left Ear: External ear normal.  Nose: Nose normal.  Eyes: Right eye exhibits no discharge. Left eye exhibits no discharge.  Cardiovascular: Normal rate, regular rhythm and normal heart sounds.   Pulses:      Radial pulses are 2+ on the right side, and 2+ on the left side.  Pulmonary/Chest: Effort normal and breath sounds normal.    Chest tenderness.  Abdominal: Soft. There is no tenderness.  Neurological: She is alert and  oriented to person, place, and time.  Skin: Skin is warm and dry.  No obvious rash or evidence of herpes zoster.  Nursing note and vitals reviewed.   ED Course  Procedures   DIAGNOSTIC STUDIES: Oxygen Saturation is 99% on RA, normal by my interpretation.    COORDINATION OF CARE: 8:28 PM Ordered labs, chest x-ray, EKG, and Norco. Discussed treatment plan with pt at bedside and pt agreed to plan.  Labs Review Labs Reviewed  BASIC METABOLIC PANEL - Abnormal; Notable for the following:    Glucose, Bld 282 (*)    GFR calc non Af Amer 60 (*)    All other components within normal limits  CBC - Abnormal; Notable for the following:    RBC 3.73 (*)    Hemoglobin 11.0 (*)    HCT 33.4 (*)    All other components within normal limits  D-DIMER, QUANTITATIVE (NOT AT [facility]) - Abnormal; Notable for the following:    D-Dimer, Quant 0.53 (*)    All other components within normal limits  TROPONIN I    Imaging Review Dg Chest 2 View  07/07/2015  CLINICAL DATA:  Chest pain for 2 days. EXAM: CHEST  2 VIEW COMPARISON:  CT chest 11/21/2014 FINDINGS: The heart size and mediastinal contours are within normal limits. Both lungs are clear. The visualized skeletal structures are unremarkable. IMPRESSION: No active cardiopulmonary disease. Electronically Signed   By: Elige KoHetal  Patel   On: 07/07/2015 20:49   I have personally reviewed and evaluated these images and lab results as part of my medical decision-making.   EKG Interpretation   Date/Time:  Sunday July 07 2015 20:32:51 EDT Ventricular Rate:  53 PR Interval:  149 QRS Duration: 106 QT Interval:  432 QTC Calculation: 406 R Axis:   -2 Text Interpretation:  Sinus rhythm Abnormal R-wave progression, early  transition Left ventricular hypertrophy no significant change since 2015  Confirmed by Jhett Fretwell  MD, Shivangi Lutz ([id number]) on 07/07/2015 9:01:29 PM      MDM   Final diagnoses:  Right-sided chest wall pain    Patient's chest pain is very  reproducible under her right breast. There is no rash to suggest herpes zoster. Likely this is related to the coughing she's been experiencing over the last week. No signs of pneumonia. Doubt PE but given pleuritic symptoms a d-dimer was sent. Age-adjusted d-dimer is negative. Troponin negative after over 8 hours of symptoms. Given clear chest wall pain, right-sided pain, I do not think that a second troponin is indicated. ECG with no significant change. Treat with pain medicine and follow-up with PCP if not better. Discussed strict return precautions.  I personally performed the services described in this documentation, which was scribed in my presence. The recorded information has been reviewed and is accurate.   Pricilla LovelessScott Carlee Tesfaye, MD 07/07/15 [phone]

## 2016-01-04 ENCOUNTER — Encounter (HOSPITAL_BASED_OUTPATIENT_CLINIC_OR_DEPARTMENT_OTHER): Payer: Self-pay | Admitting: *Deleted

## 2016-01-04 ENCOUNTER — Emergency Department (HOSPITAL_BASED_OUTPATIENT_CLINIC_OR_DEPARTMENT_OTHER)
Admission: EM | Admit: 2016-01-04 | Discharge: 2016-01-04 | Disposition: A | Discharge status: Home / Self Care | Payer: Medicare Other | Attending: Emergency Medicine | Admitting: Emergency Medicine

## 2016-01-04 DIAGNOSIS — Z7984 Long term (current) use of oral hypoglycemic drugs: Secondary | ICD-10-CM | POA: Insufficient documentation

## 2016-01-04 DIAGNOSIS — F410 Panic disorder [episodic paroxysmal anxiety] without agoraphobia: Secondary | ICD-10-CM

## 2016-01-04 DIAGNOSIS — I10 Essential (primary) hypertension: Secondary | ICD-10-CM | POA: Insufficient documentation

## 2016-01-04 DIAGNOSIS — E119 Type 2 diabetes mellitus without complications: Secondary | ICD-10-CM | POA: Diagnosis not present

## 2016-01-04 DIAGNOSIS — Z79899 Other long term (current) drug therapy: Secondary | ICD-10-CM | POA: Insufficient documentation

## 2016-01-04 HISTORY — DX: Panic disorder (episodic paroxysmal anxiety): F41.0

## 2016-01-04 LAB — BASIC METABOLIC PANEL
Anion gap: 7 (ref 5–15)
BUN: 18 mg/dL (ref 6–20)
CALCIUM: 9.1 mg/dL (ref 8.9–10.3)
CO2: 29 mmol/L (ref 22–32)
CREATININE: 1.32 mg/dL — AB (ref 0.44–1.00)
Chloride: 102 mmol/L (ref 101–111)
GFR calc non Af Amer: 39 mL/min — ABNORMAL LOW (ref >60)
GFR, EST AFRICAN AMERICAN: 45 mL/min — AB (ref >60)
Glucose, Bld: 313 mg/dL — ABNORMAL HIGH (ref 65–99)
Potassium: 3.3 mmol/L — ABNORMAL LOW (ref 3.5–5.1)
Sodium: 138 mmol/L (ref 135–145)

## 2016-01-04 LAB — CBC WITH DIFFERENTIAL/PLATELET
BASOS PCT: 1 %
Basophils Absolute: 0.1 10*3/uL (ref 0.0–0.1)
EOS ABS: 0.1 10*3/uL (ref 0.0–0.7)
EOS PCT: 1 %
HEMATOCRIT: 31.3 % — AB (ref 36.0–46.0)
Hemoglobin: 10.3 g/dL — ABNORMAL LOW (ref 12.0–15.0)
Lymphocytes Relative: 22 %
Lymphs Abs: 1.4 10*3/uL (ref 0.7–4.0)
MCH: 30.8 pg (ref 26.0–34.0)
MCHC: 32.9 g/dL (ref 30.0–36.0)
MCV: 93.7 fL (ref 78.0–100.0)
MONO ABS: 0.5 10*3/uL (ref 0.1–1.0)
Monocytes Relative: 8 %
NEUTROS ABS: 4.4 10*3/uL (ref 1.7–7.7)
Neutrophils Relative %: 68 %
PLATELETS: 242 10*3/uL (ref 150–400)
RBC: 3.34 MIL/uL — ABNORMAL LOW (ref 3.87–5.11)
RDW: 12.7 % (ref 11.5–15.5)
WBC: 6.5 10*3/uL (ref 4.0–10.5)

## 2016-01-04 LAB — TSH: TSH: 41.286 u[IU]/mL — AB (ref 0.350–4.500)

## 2016-01-04 MED ORDER — LORAZEPAM 1 MG PO TABS: 1.0000 mg | ORAL_TABLET | Freq: Two times a day (BID) | ORAL | 0 refills | Status: DC | PRN

## 2016-01-04 MED ORDER — LORAZEPAM 1 MG PO TABS
1.0000 mg | ORAL_TABLET | Freq: Once | ORAL | Status: AC
  Administered 2016-01-04: 1 mg via ORAL
  Filled 2016-01-04: qty 1

## 2016-01-04 MED ORDER — LORAZEPAM 1 MG PO TABS: 1.0000 mg | ORAL_TABLET | Freq: Three times a day (TID) | ORAL | 0 refills | Status: DC | PRN

## 2016-01-04 MED ORDER — POTASSIUM CHLORIDE CRYS ER 20 MEQ PO TBCR: 40.0000 meq | EXTENDED_RELEASE_TABLET | Freq: Every day | ORAL | 0 refills | Status: DC

## 2016-01-04 NOTE — ED Notes (Signed)
Patient is alert and oriented x4.  She is complaining of anxiety that started earlier today.  Patient admits to having some life stressors over the weekend that caught up with her today

## 2016-01-04 NOTE — ED Provider Notes (Addendum)
[facility] Provider Note   CSN: [id number] Arrival date & time: 01/04/16  1653  By signing my name below, I, Alyssa Grove, attest that this documentation has been prepared under the direction and in the presence of Shaune Pollack, MD. Electronically Signed: Alyssa Grove, ED Scribe. 01/04/16. 5:17 PM.   History   Chief Complaint Chief Complaint  Patient presents with  . Panic Attack   The history is provided by the patient. No language interpreter was used.    HPI Comments: Shannon Ballard is a 70 y.o. female with PMHx of DM, HTN and Panic attacks who presents to the Emergency Department complaining of a gradual onset, episodic panic attack onset this afternoon. Pt states over the last 3 days she has increasing anxiety, difficulty sleeping, accompanied by episodes where she feels acutely panicked with a sense of doom, tingling in her hands/feet, and extreme anxiety. Pt has been experiencing panic attack over 5 years. She states she does not have  panic attacks often and had her last panic attack 2 years ago. Pt experiences slight claustrophobia and heart palpitations.She believes stress is the trigger and she admits to being under increased stress the last week. She was given medication for her last panic attack (believes it is Ativan) that she took as needed. She has not had any recent medication changes. Pt denies chest pain, shortness of breath, nausea, tremors.  Past Medical History:  Diagnosis Date  . Diabetes mellitus without complication (HCC)   . Hypertension   . Panic attack   . Thyroid disease     Patient Active Problem List   Diagnosis Date Noted  . Hammer toe of right foot 06/18/2014  . Pain in toe of right foot 06/18/2014    Past Surgical History:  Procedure Laterality Date  . CHOLECYSTECTOMY    . THYROIDECTOMY      OB History    No data available       Home Medications    Prior to Admission medications   Medication Sig Start Date End Date  Taking? Authorizing Provider  sitaGLIPtin-metformin (JANUMET) 50-1000 MG tablet Take 1 tablet by mouth 2 (two) times daily with a meal.   Yes Historical Provider, MD  amLODipine (NORVASC) 10 MG tablet Take 10 mg by mouth daily.    Historical Provider, MD  aspirin 81 MG tablet Take 81 mg by mouth daily.    Historical Provider, MD  HYDROcodone-acetaminophen (NORCO) 5-325 MG tablet Take 1 tablet by mouth every 4 (four) hours as needed for severe pain. 07/07/15   Pricilla Loveless, MD  ibuprofen (ADVIL,MOTRIN) 600 MG tablet Take 1 tablet (600 mg total) by mouth every 6 (six) hours as needed. 01/09/14   Geoffery Lyons, MD  levothyroxine (SYNTHROID, LEVOTHROID) 137 MCG tablet Take 137 mcg by mouth daily before breakfast.    Historical Provider, MD  LORazepam (ATIVAN) 1 MG tablet Take 1 tablet (1 mg total) by mouth 2 (two) times daily as needed for anxiety. 01/04/16   Shaune Pollack, MD  losartan (COZAAR) 25 MG tablet Take 25 mg by mouth daily.    Historical Provider, MD  metFORMIN (GLUCOPHAGE) 1000 MG tablet Take 1,000 mg by mouth 2 (two) times daily with a meal.    Historical Provider, MD  Multiple Vitamin (MULTIVITAMIN) tablet Take 1 tablet by mouth daily.    Historical Provider, MD  potassium chloride SA (K-DUR,KLOR-CON) 20 MEQ tablet Take 2 tablets (40 mEq total) by mouth daily. 01/04/16 01/07/16  Shaune Pollack, MD  Family History No family history on file.  Social History Social History  Substance Use Topics  . Smoking status: Never Smoker  . Smokeless tobacco: Never Used  . Alcohol use No     Allergies   Celebrex [celecoxib]; Lisinopril; Meloxicam; and Penicillins   Review of Systems Review of Systems  Constitutional: Negative for chills and fever.  HENT: Negative for congestion, rhinorrhea and sore throat.   Eyes: Negative for visual disturbance.  Respiratory: Negative for cough, shortness of breath and wheezing.   Cardiovascular: Negative for chest pain and leg swelling.    Gastrointestinal: Negative for abdominal pain, diarrhea, nausea and vomiting.  Genitourinary: Negative for dysuria, flank pain, vaginal bleeding and vaginal discharge.  Musculoskeletal: Negative for neck pain.  Skin: Negative for rash.  Allergic/Immunologic: Negative for immunocompromised state.  Neurological: Negative for tremors, syncope and headaches.  Hematological: Does not bruise/bleed easily.  Psychiatric/Behavioral: The patient is nervous/anxious.   All other systems reviewed and are negative.  Physical Exam Updated Vital Signs BP 166/80 (BP Location: Right Arm)   Pulse 61   Temp 98.9 F (37.2 C) (Oral)   Resp 14   Ht 5\' 10"  (1.778 m)   Wt 195 lb (88.5 kg)   SpO2 99%   BMI 27.98 kg/m   Physical Exam  Constitutional: She is oriented to person, place, and time. She appears well-developed and well-nourished. No distress.  HENT:  Head: Normocephalic and atraumatic.  Eyes: Conjunctivae are normal.  Neck: Neck supple.  Cardiovascular: Normal rate, regular rhythm and normal heart sounds.  Exam reveals no friction rub.   No murmur heard. Pulmonary/Chest: Effort normal and breath sounds normal. No respiratory distress. She has no wheezes. She has no rales.  Abdominal: She exhibits no distension.  Musculoskeletal: She exhibits no edema.  Neurological: She is alert and oriented to person, place, and time. She exhibits normal muscle tone.  Skin: Skin is warm. Capillary refill takes less than 2 seconds.  Psychiatric: She has a normal mood and affect.  Nursing note and vitals reviewed.    ED Treatments / Results  DIAGNOSTIC STUDIES: Oxygen Saturation is 99% on RA, normal by my interpretation.    COORDINATION OF CARE: 5:10 PM Discussed treatment plan with pt at bedside which includes basic lab work and Ativan and pt agreed to plan.  Labs (all labs ordered are listed, but only abnormal results are displayed) Labs Reviewed  CBC WITH DIFFERENTIAL/PLATELET - Abnormal;  Notable for the following:       Result Value   RBC 3.34 (*)    Hemoglobin 10.3 (*)    HCT 31.3 (*)    All other components within normal limits  BASIC METABOLIC PANEL - Abnormal; Notable for the following:    Potassium 3.3 (*)    Glucose, Bld 313 (*)    Creatinine, Ser 1.32 (*)    GFR calc non Af Amer 39 (*)    GFR calc Af Amer 45 (*)    All other components within normal limits  TSH - Abnormal; Notable for the following:    TSH 41.286 (*)    All other components within normal limits    EKG  EKG Interpretation  Date/Time:  Saturday January 04 2016 18:08:28 EDT Ventricular Rate:  64 PR Interval:    QRS Duration: 99 QT Interval:  400 QTC Calculation: 413 R Axis:   -16 Text Interpretation:  Sinus rhythm Borderline left axis deviation Low voltage, precordial leads Abnormal R-wave progression, early transition Borderline T wave abnormalities  Baseline wander in lead(s) V6 No significant change since last tracing Artifact limits interpretation Confirmed by Berdine Rasmusson MD, Sheria LangAMERON [phone]([id number]) on 01/04/2016 7:08:15 PM       Radiology No results found.  Procedures Procedures (including critical care time)  Medications Ordered in ED Medications  LORazepam (ATIVAN) tablet 1 mg (1 mg Oral Given 01/04/16 1806)     Initial Impression / Assessment and Plan / ED Course  I have reviewed the triage vital signs and the nursing notes.  Pertinent labs & imaging results that were available during my care of the patient were reviewed by me and considered in my medical decision making (see chart for details).  Clinical Course   I personally performed the services described in this documentation, which was scribed in my presence. The recorded information has been reviewed and is accurate.  70 yo AAF with PMHx of panic disorder here with increasing anxiety and panic attack frequency in setting of stressors at home. No HI, SI, or AVH. No recent med changes. On arrival, VSS and WNL. Exam is as above  - pt is overall very well appearing and in NAD. Screening lab work obtained given age and are unremarkable.Judieth Keens. Lytes WNL with exception of mild hypokalemia - will replete. EKG is non-ischemic. Sx improved with ativan. Given well appearance, normal labs, and h/o similar sx, will give short course of ativan PRN, advise PCP f/u to discuss further treatment, and good return precautions.  NOTE: TSH returned high after discharge - notified flow manager. Pt has no s/s hyperthyroidism and is s/p thyroidectomy. Will notify PCP, no emergent intervention needed.  Final Clinical Impressions(s) / ED Diagnoses   Final diagnoses:  Panic disorder    New Prescriptions Discharge Medication List as of 01/04/2016  7:11 PM       Shaune Pollackameron Shawntel Farnworth, MD 01/05/16 1152    Shaune Pollackameron Jaquin Coy, MD 01/05/16 1154

## 2016-01-04 NOTE — ED Triage Notes (Signed)
Patient states that she has been experiencing an anxiety attack since yesterday, no medications taken.

## 2017-02-05 ENCOUNTER — Emergency Department (HOSPITAL_BASED_OUTPATIENT_CLINIC_OR_DEPARTMENT_OTHER)
Admission: EM | Admit: 2017-02-05 | Discharge: 2017-02-06 | Disposition: A | Discharge status: Home / Self Care | Payer: Medicare Other | Attending: Emergency Medicine | Admitting: Emergency Medicine

## 2017-02-05 ENCOUNTER — Other Ambulatory Visit: Payer: Self-pay

## 2017-02-05 ENCOUNTER — Encounter (HOSPITAL_BASED_OUTPATIENT_CLINIC_OR_DEPARTMENT_OTHER): Payer: Self-pay | Admitting: *Deleted

## 2017-02-05 DIAGNOSIS — Z7984 Long term (current) use of oral hypoglycemic drugs: Secondary | ICD-10-CM | POA: Insufficient documentation

## 2017-02-05 DIAGNOSIS — I10 Essential (primary) hypertension: Secondary | ICD-10-CM | POA: Diagnosis not present

## 2017-02-05 DIAGNOSIS — F419 Anxiety disorder, unspecified: Secondary | ICD-10-CM | POA: Diagnosis present

## 2017-02-05 DIAGNOSIS — Z79899 Other long term (current) drug therapy: Secondary | ICD-10-CM | POA: Diagnosis not present

## 2017-02-05 DIAGNOSIS — Z791 Long term (current) use of non-steroidal anti-inflammatories (NSAID): Secondary | ICD-10-CM | POA: Diagnosis not present

## 2017-02-05 DIAGNOSIS — E119 Type 2 diabetes mellitus without complications: Secondary | ICD-10-CM | POA: Insufficient documentation

## 2017-02-05 DIAGNOSIS — Z7982 Long term (current) use of aspirin: Secondary | ICD-10-CM | POA: Diagnosis not present

## 2017-02-05 LAB — BASIC METABOLIC PANEL
Anion gap: 4 — ABNORMAL LOW (ref 5–15)
BUN: 16 mg/dL (ref 6–20)
CHLORIDE: 103 mmol/L (ref 101–111)
CO2: 27 mmol/L (ref 22–32)
CREATININE: 0.96 mg/dL (ref 0.44–1.00)
Calcium: 8.8 mg/dL — ABNORMAL LOW (ref 8.9–10.3)
GFR calc non Af Amer: 57 mL/min — ABNORMAL LOW (ref >60)
Glucose, Bld: 236 mg/dL — ABNORMAL HIGH (ref 65–99)
POTASSIUM: 4 mmol/L (ref 3.5–5.1)
SODIUM: 134 mmol/L — AB (ref 135–145)

## 2017-02-05 LAB — URINALYSIS, MICROSCOPIC (REFLEX): RBC / HPF: NONE SEEN RBC/hpf (ref 0–5)

## 2017-02-05 LAB — URINALYSIS, ROUTINE W REFLEX MICROSCOPIC
Bilirubin Urine: NEGATIVE
Glucose, UA: 100 mg/dL — AB
Hgb urine dipstick: NEGATIVE
Ketones, ur: NEGATIVE mg/dL
Nitrite: NEGATIVE
PH: 6 (ref 5.0–8.0)
Protein, ur: NEGATIVE mg/dL

## 2017-02-05 LAB — CBC WITH DIFFERENTIAL/PLATELET
Basophils Absolute: 0.1 10*3/uL (ref 0.0–0.1)
Basophils Relative: 1 %
EOS ABS: 0.2 10*3/uL (ref 0.0–0.7)
Eosinophils Relative: 2 %
HEMATOCRIT: 29.4 % — AB (ref 36.0–46.0)
HEMOGLOBIN: 9.5 g/dL — AB (ref 12.0–15.0)
LYMPHS ABS: 1.5 10*3/uL (ref 0.7–4.0)
Lymphocytes Relative: 19 %
MCH: 30.2 pg (ref 26.0–34.0)
MCHC: 32.3 g/dL (ref 30.0–36.0)
MCV: 93.3 fL (ref 78.0–100.0)
Monocytes Absolute: 0.7 10*3/uL (ref 0.1–1.0)
Monocytes Relative: 9 %
NEUTROS ABS: 5.6 10*3/uL (ref 1.7–7.7)
NEUTROS PCT: 69 %
Platelets: 261 10*3/uL (ref 150–400)
RBC: 3.15 MIL/uL — AB (ref 3.87–5.11)
RDW: 12.2 % (ref 11.5–15.5)
WBC: 8.1 10*3/uL (ref 4.0–10.5)

## 2017-02-05 MED ORDER — LORAZEPAM 1 MG PO TABS: 0.5000 mg | ORAL_TABLET | Freq: Three times a day (TID) | ORAL | 0 refills | Status: DC | PRN

## 2017-02-05 MED ORDER — LORAZEPAM 1 MG PO TABS
0.5000 mg | ORAL_TABLET | Freq: Once | ORAL | Status: AC
  Administered 2017-02-05: 0.5 mg via ORAL
  Filled 2017-02-05: qty 1

## 2017-02-05 NOTE — Discharge Instructions (Signed)
Please follow-up with PCP on Monday regarding further management.  Please return without fail for worsening symptoms including difficulty breathing, passing out, confusion, chest pain or any other symptoms concerning to you.

## 2017-02-05 NOTE — ED Triage Notes (Signed)
Anxiety attacks for the past 2 weeks. They are getting more frequent. She does not take medication to take.

## 2017-02-05 NOTE — ED Provider Notes (Signed)
[facility] EMERGENCY DEPARTMENT Provider Note   CSN: [id number] Arrival date & time: 02/05/17  1947     History   Chief Complaint No chief complaint on file.   HPI [name] is a 71 y.o. female.  HPI 71 year old female who presents with increased anxiety over the last 2 weeks.  History of diabetes, hypertension, hyperlipidemia.  Reports that she has been given Ativan in the past for her anxiety, but for a while she do not need to take any medications for her anxiety so her PCP stopped scribing her medications for it.  She reports that over the last 2 weeks she has had increased anxiety, stating that she has difficulty being at night and just feels very scared at times.  Denies any shortness of breath, chest pain, syncope or near syncope, nausea or vomiting or confusion with her symptoms of anxiety.  Does occasionally have palpitations.  Her family brought her to the ED for evaluation as unable to get her to see PCP sooner. No recent illness, including fever, cough, n/v/d, or urinary complaints.   Past Medical History:  Diagnosis Date  . Diabetes mellitus without complication (HCC)   . Hypertension   . Panic attack   . Thyroid disease     Patient Active Problem List   Diagnosis Date Noted  . Hammer toe of right foot 06/18/2014  . Pain in toe of right foot 06/18/2014    Past Surgical History:  Procedure Laterality Date  . CHOLECYSTECTOMY    . THYROIDECTOMY      OB History    No data available       Home Medications    Prior to Admission medications   Medication Sig Start Date End Date Taking? Authorizing Provider  amLODipine (NORVASC) 10 MG tablet Take 10 mg by mouth daily.   Yes [provider]  atorvastatin (LIPITOR) 40 MG tablet Take 40 mg by mouth daily.   Yes [provider]  levothyroxine (SYNTHROID, LEVOTHROID) 137 MCG tablet Take 137 mcg by mouth daily before breakfast.   Yes [provider]  losartan (COZAAR) 25  MG tablet Take 25 mg by mouth daily.   Yes [provider]  sitaGLIPtin-metformin (JANUMET) 50-1000 MG tablet Take 1 tablet by mouth 2 (two) times daily with a meal.   Yes [provider]  aspirin 81 MG tablet Take 81 mg by mouth daily.    [provider]  HYDROcodone-acetaminophen (NORCO) 5-325 MG tablet Take 1 tablet by mouth every 4 (four) hours as needed for severe pain. 07/07/15   Pricilla LovelessGoldston, Scott, MD  ibuprofen (ADVIL,MOTRIN) 600 MG tablet Take 1 tablet (600 mg total) by mouth every 6 (six) hours as needed. 01/09/14   Geoffery Lyonselo, Douglas, MD  LORazepam (ATIVAN) 1 MG tablet Take 0.5 tablets (0.5 mg total) by mouth 3 (three) times daily as needed for anxiety. 02/05/17   Lavera GuiseLiu, Claira Jeter Duo, MD  metFORMIN (GLUCOPHAGE) 1000 MG tablet Take 1,000 mg by mouth 2 (two) times daily with a meal.    [provider]  Multiple Vitamin (MULTIVITAMIN) tablet Take 1 tablet by mouth daily.    [provider]  potassium chloride SA (K-DUR,KLOR-CON) 20 MEQ tablet Take 2 tablets (40 mEq total) by mouth daily. 01/04/16 01/07/16  Shaune PollackIsaacs, Cameron, MD    Family History No family history on file.  Social History Social History   Tobacco Use  . Smoking status: Never Smoker  . Smokeless tobacco: Never Used  Substance Use Topics  .  Alcohol use: No    Alcohol/week: 0.0 oz  . Drug use: No     Allergies   Celebrex [celecoxib]; Lisinopril; Meloxicam; and Penicillins   Review of Systems Review of Systems  Constitutional: Negative for fever.  Respiratory: Negative for cough and shortness of breath.   Cardiovascular: Negative for chest pain and leg swelling.  Gastrointestinal: Negative for abdominal pain, diarrhea and vomiting.  All other systems reviewed and are negative.    Physical Exam Updated Vital Signs BP (!) 142/66   Pulse 74   Temp 98.7 F (37.1 C) (Oral)   Resp 20   Ht 5' 10.5" (1.791 m)   Wt 88.5 kg (195 lb)   SpO2 99%   BMI 27.58 kg/m   Physical  Exam Physical Exam  Nursing note and vitals reviewed. Constitutional: Well developed, well nourished, non-toxic, and in no acute distress Head: Normocephalic and atraumatic.  Mouth/Throat: Oropharynx is clear and moist.  Neck: Normal range of motion. Neck supple.  Cardiovascular: Normal rate and regular rhythm.   Pulmonary/Chest: Effort normal and breath sounds normal.  Abdominal: Soft. There is no tenderness. There is no rebound and no guarding.  Musculoskeletal: Normal range of motion.  Neurological: Alert, no facial droop, fluent speech, moves all extremities symmetrically Skin: Skin is warm and dry.  Psychiatric: Cooperative    ED Treatments / Results  Labs (all labs ordered are listed, but only abnormal results are displayed) Labs Reviewed  CBC WITH DIFFERENTIAL/PLATELET - Abnormal; Notable for the following components:      Result Value   RBC 3.15 (*)    Hemoglobin 9.5 (*)    HCT 29.4 (*)    All other components within normal limits  BASIC METABOLIC PANEL - Abnormal; Notable for the following components:   Sodium 134 (*)    Glucose, Bld 236 (*)    Calcium 8.8 (*)    GFR calc non Af Amer 57 (*)    Anion gap 4 (*)    All other components within normal limits  URINALYSIS, ROUTINE W REFLEX MICROSCOPIC - Abnormal; Notable for the following components:   Specific Gravity, Urine <1.005 (*)    Glucose, UA 100 (*)    Leukocytes, UA SMALL (*)    All other components within normal limits  URINALYSIS, MICROSCOPIC (REFLEX) - Abnormal; Notable for the following components:   Bacteria, UA RARE (*)    Squamous Epithelial / LPF 0-5 (*)    All other components within normal limits    EKG  EKG Interpretation  Date/Time:  Friday February 05 2017 21:00:23 EST Ventricular Rate:  67 PR Interval:    QRS Duration: 89 QT Interval:  422 QTC Calculation: 446 R Axis:   4 Text Interpretation:  Sinus rhythm Low voltage, precordial leads RSR' in V1 or V2, probably normal variant similar  to previous EKG  Confirmed by Crista Curb [phone]) on 02/05/2017 9:11:50 PM       Radiology No results found.  Procedures Procedures (including critical care time)  Medications Ordered in ED Medications  LORazepam (ATIVAN) tablet 0.5 mg (0.5 mg Oral Given 02/05/17 2047)     Initial Impression / Assessment and Plan / ED Course  I have reviewed the triage vital signs and the nursing notes.  Pertinent labs & imaging results that were available during my care of the patient were reviewed by me and considered in my medical decision making (see chart for details).     71 year old female who presents with increased anxiety at home.  She states that these are similar to when she has had anxiety attacks in the past.  She is nontoxic in no acute distress with normal vital signs. mentation normal.  She has no symptoms of chest pain, difficulty breathing, syncope or near syncope with her anxiety, she just states that she has a lot of fear occasional palpitations.  Her work showing no major electrolyte or metabolic derangements.  She has slightly worsening anemia with a hemoglobin of 9.5 from 10.  No history of GI bleeding or other bleeding history.  UA unremarkable.  I do feel that she is stable for outpatient management, I recommended that she follow-up closely with her primary care doctor on Monday.  She did have a trial of a small dose of Ativan here with improvement.  Will discharge home with very small prescription until she can see her primary care doctor after the weekend. Strict return and follow-up instructions reviewed. She expressed understanding of all discharge instructions and felt comfortable with the plan of care.   Final Clinical Impressions(s) / ED Diagnoses   Final diagnoses:  Anxiety    ED Discharge Orders        Ordered    LORazepam (ATIVAN) 1 MG tablet  3 times daily PRN     02/05/17 2132       Lavera GuiseLiu, Maurilio Puryear Duo, MD 02/05/17 2135

## 2017-11-05 ENCOUNTER — Encounter (HOSPITAL_BASED_OUTPATIENT_CLINIC_OR_DEPARTMENT_OTHER): Payer: Self-pay | Admitting: Emergency Medicine

## 2017-11-05 ENCOUNTER — Emergency Department (HOSPITAL_BASED_OUTPATIENT_CLINIC_OR_DEPARTMENT_OTHER)
Admission: EM | Admit: 2017-11-05 | Discharge: 2017-11-05 | Disposition: A | Discharge status: Home / Self Care | Payer: Medicare Other | Attending: Emergency Medicine | Admitting: Emergency Medicine

## 2017-11-05 ENCOUNTER — Other Ambulatory Visit: Payer: Self-pay

## 2017-11-05 ENCOUNTER — Emergency Department (HOSPITAL_BASED_OUTPATIENT_CLINIC_OR_DEPARTMENT_OTHER): Payer: Medicare Other

## 2017-11-05 DIAGNOSIS — M546 Pain in thoracic spine: Secondary | ICD-10-CM | POA: Diagnosis not present

## 2017-11-05 DIAGNOSIS — G309 Alzheimer's disease, unspecified: Secondary | ICD-10-CM | POA: Diagnosis not present

## 2017-11-05 DIAGNOSIS — Z79899 Other long term (current) drug therapy: Secondary | ICD-10-CM | POA: Diagnosis not present

## 2017-11-05 DIAGNOSIS — R0602 Shortness of breath: Secondary | ICD-10-CM | POA: Insufficient documentation

## 2017-11-05 DIAGNOSIS — R079 Chest pain, unspecified: Secondary | ICD-10-CM | POA: Diagnosis present

## 2017-11-05 DIAGNOSIS — Z7984 Long term (current) use of oral hypoglycemic drugs: Secondary | ICD-10-CM | POA: Diagnosis not present

## 2017-11-05 DIAGNOSIS — R600 Localized edema: Secondary | ICD-10-CM | POA: Diagnosis not present

## 2017-11-05 DIAGNOSIS — E119 Type 2 diabetes mellitus without complications: Secondary | ICD-10-CM | POA: Diagnosis not present

## 2017-11-05 DIAGNOSIS — Z7982 Long term (current) use of aspirin: Secondary | ICD-10-CM | POA: Insufficient documentation

## 2017-11-05 HISTORY — DX: Alzheimer's disease, unspecified: G30.9

## 2017-11-05 HISTORY — DX: Dementia in other diseases classified elsewhere, unspecified severity, without behavioral disturbance, psychotic disturbance, mood disturbance, and anxiety: F02.80

## 2017-11-05 LAB — TROPONIN I

## 2017-11-05 LAB — CBC
HEMATOCRIT: 30.9 % — AB (ref 36.0–46.0)
Hemoglobin: 10.2 g/dL — ABNORMAL LOW (ref 12.0–15.0)
MCH: 30 pg (ref 26.0–34.0)
MCHC: 33 g/dL (ref 30.0–36.0)
MCV: 90.9 fL (ref 78.0–100.0)
PLATELETS: 207 10*3/uL (ref 150–400)
RBC: 3.4 MIL/uL — ABNORMAL LOW (ref 3.87–5.11)
RDW: 15.2 % (ref 11.5–15.5)
WBC: 8.7 10*3/uL (ref 4.0–10.5)

## 2017-11-05 LAB — BASIC METABOLIC PANEL
Anion gap: 12 (ref 5–15)
BUN: 15 mg/dL (ref 8–23)
CALCIUM: 8.9 mg/dL (ref 8.9–10.3)
CO2: 24 mmol/L (ref 22–32)
Chloride: 102 mmol/L (ref 98–111)
Creatinine, Ser: 1.2 mg/dL — ABNORMAL HIGH (ref 0.44–1.00)
GFR, EST AFRICAN AMERICAN: 50 mL/min — AB (ref >60)
GFR, EST NON AFRICAN AMERICAN: 43 mL/min — AB (ref >60)
Glucose, Bld: 111 mg/dL — ABNORMAL HIGH (ref 70–99)
Potassium: 3.6 mmol/L (ref 3.5–5.1)
Sodium: 138 mmol/L (ref 135–145)

## 2017-11-05 MED ORDER — MORPHINE SULFATE (PF) 4 MG/ML IV SOLN
4.0000 mg | Freq: Once | INTRAVENOUS | Status: DC
  Filled 2017-11-05: qty 1

## 2017-11-05 MED ORDER — IOPAMIDOL (ISOVUE-370) INJECTION 76%
100.0000 mL | Freq: Once | INTRAVENOUS | Status: AC | PRN
  Administered 2017-11-05: 80 mL via INTRAVENOUS

## 2017-11-05 MED ORDER — SODIUM CHLORIDE 0.9 % IV BOLUS
500.0000 mL | Freq: Once | INTRAVENOUS | Status: AC
  Administered 2017-11-05: 500 mL via INTRAVENOUS

## 2017-11-05 NOTE — ED Notes (Signed)
Patient transported to CT 

## 2017-11-05 NOTE — ED Triage Notes (Signed)
PResents with sharp right sided chest pain that began yesterday. [facility]adol for the pain with no relief. endorses SOB

## 2017-11-05 NOTE — Discharge Instructions (Addendum)
You were seen in the emergency department for right-sided back and chest pain that is been going on a few weeks.  You had an EKG blood work and a CAT scan of your chest that did not show an obvious cause of your symptoms.  The CAT scan of the chest did comment upon some pulmonary fibrosis which seems like it may be a new diagnosis for you.  He will need to follow-up with your primary care doctor for continued work-up of your symptoms.  Please return if any concerns.

## 2017-11-05 NOTE — ED Provider Notes (Signed)
[facility] EMERGENCY DEPARTMENT Provider Note   CSN: [id number] Arrival date & time: 11/05/17  2053     History   Chief Complaint Chief Complaint  Patient presents with  . Chest Pain    HPI Shannon Ballard is a 72 y.o. female.  She is brought in by family member complaining of some right-sided thoracic back pain radiating around to her right anterior chest.  Is been going on for a couple of weeks and she seen her primary care doctor.  They seem to believe that this spine or nerve related and have tried her with some dexamethasone some tramadol and a muscle relaxant.  She had a spine x-ray today that was unremarkable.  She has good days and bad days the pain does not seem to be predictable with movement or deep breath or eating.  She is had her gallbladder out.  No fevers no chills no cough.  No prior history of any cardiac disease.  She is noticed no rashes.  Rates the pain is 5 out of 10 and sharp in nature and does seem to be increased with movement. Hx of cholecystectomy.   The history is provided by the patient and a relative.  Chest Pain   This is a new problem. The current episode started more than 1 week ago. The problem occurs daily. The problem has not changed since onset.The pain is present in the lateral region. The pain is at a severity of 5/10. The quality of the pain is described as sharp. The pain radiates to the mid back. The symptoms are aggravated by exertion and certain positions. Associated symptoms include back pain, lower extremity edema (chronic bilateral) and shortness of breath (sometimes with exertion). Pertinent negatives include no abdominal pain, no cough, no fever, no headaches, no hemoptysis, no nausea and no vomiting. She has tried rest (steroids, tramadol, robaxin) for the symptoms. The treatment provided no relief.  Her past medical history is significant for diabetes and hypertension.    Past Medical History:  Diagnosis Date  . Alzheimer's  disease   . Diabetes mellitus without complication (HCC)   . Hypertension   . Panic attack   . Thyroid disease     Patient Active Problem List   Diagnosis Date Noted  . Hammer toe of right foot 06/18/2014  . Pain in toe of right foot 06/18/2014    Past Surgical History:  Procedure Laterality Date  . CHOLECYSTECTOMY    . THYROIDECTOMY       OB History   None      Home Medications    Prior to Admission medications   Medication Sig Start Date End Date Taking? Authorizing Provider  amLODipine (NORVASC) 10 MG tablet Take 10 mg by mouth daily.    [provider]  aspirin 81 MG tablet Take 81 mg by mouth daily.    [provider]  atorvastatin (LIPITOR) 40 MG tablet Take 40 mg by mouth daily.    [provider]  HYDROcodone-acetaminophen (NORCO) 5-325 MG tablet Take 1 tablet by mouth every 4 (four) hours as needed for severe pain. 07/07/15   Pricilla Loveless, MD  ibuprofen (ADVIL,MOTRIN) 600 MG tablet Take 1 tablet (600 mg total) by mouth every 6 (six) hours as needed. 01/09/14   Geoffery Lyons, MD  levothyroxine (SYNTHROID, LEVOTHROID) 137 MCG tablet Take 137 mcg by mouth daily before breakfast.    [provider]  LORazepam (ATIVAN) 1 MG tablet Take 0.5 tablets (0.5 mg total) by mouth  3 (three) times daily as needed for anxiety. 02/05/17   Lavera GuiseLiu, Dana Duo, MD  losartan (COZAAR) 25 MG tablet Take 25 mg by mouth daily.    [provider]  metFORMIN (GLUCOPHAGE) 1000 MG tablet Take 1,000 mg by mouth 2 (two) times daily with a meal.    [provider]  Multiple Vitamin (MULTIVITAMIN) tablet Take 1 tablet by mouth daily.    [provider]  potassium chloride SA (K-DUR,KLOR-CON) 20 MEQ tablet Take 2 tablets (40 mEq total) by mouth daily. 01/04/16 01/07/16  Shaune PollackIsaacs, Cameron, MD  sitaGLIPtin-metformin (JANUMET) 50-1000 MG tablet Take 1 tablet by mouth 2 (two) times daily with a meal.    [provider]    Family  History History reviewed. No pertinent family history.  Social History Social History   Tobacco Use  . Smoking status: Never Smoker  . Smokeless tobacco: Never Used  Substance Use Topics  . Alcohol use: No    Alcohol/week: 0.0 standard drinks  . Drug use: No     Allergies   Celebrex [celecoxib]; Lisinopril; Meloxicam; and Penicillins   Review of Systems Review of Systems  Constitutional: Negative for fever.  HENT: Negative for sore throat.   Eyes: Negative for visual disturbance.  Respiratory: Positive for shortness of breath (sometimes with exertion). Negative for cough and hemoptysis.   Cardiovascular: Positive for chest pain and leg swelling.  Gastrointestinal: Negative for abdominal pain, nausea and vomiting.  Genitourinary: Negative for dysuria.  Musculoskeletal: Positive for back pain.  Skin: Negative for rash.  Neurological: Negative for headaches.     Physical Exam Updated Vital Signs Pulse 66   Temp 98.6 F (37 C) (Oral)   Resp 18   SpO2 99%   Physical Exam  Constitutional: She appears well-developed and well-nourished. No distress.  HENT:  Head: Normocephalic and atraumatic.  Mouth/Throat: Oropharynx is clear and moist.  Eyes: Conjunctivae are normal.  Neck: Neck supple.  Cardiovascular: Normal rate, regular rhythm, normal heart sounds, intact distal pulses and normal pulses.  No murmur heard. Pulmonary/Chest: Effort normal and breath sounds normal. No stridor. No respiratory distress. She has no wheezes.  Abdominal: Soft. There is no tenderness.  Musculoskeletal: Normal range of motion.       Right lower leg: She exhibits edema (trace bilateral). She exhibits no tenderness.       Left lower leg: She exhibits edema. She exhibits no tenderness.  Upper back no reproducible tenderness.  No obvious skin lesions.  Neurological: She is alert. GCS eye subscore is 4. GCS verbal subscore is 5. GCS motor subscore is 6.  Skin: Skin is warm and dry. Capillary  refill takes less than 2 seconds.  Psychiatric: She has a normal mood and affect.  Nursing note and vitals reviewed.    ED Treatments / Results  Labs (all labs ordered are listed, but only abnormal results are displayed) Labs Reviewed  CBC - Abnormal; Notable for the following components:      Result Value   RBC 3.40 (*)    Hemoglobin 10.2 (*)    HCT 30.9 (*)    All other components within normal limits  TROPONIN I  BASIC METABOLIC PANEL    EKG EKG Interpretation  Date/Time:  Friday November 05 2017 21:01:57 EDT Ventricular Rate:  65 PR Interval:    QRS Duration: 90 QT Interval:  392 QTC Calculation: 408 R Axis:   -12 Text Interpretation:  Sinus rhythm Abnormal R-wave progression, early transition similar pattern to prior 11/18  Confirmed by Meridee Score [phone]) on 11/05/2017 9:12:04 PM   Radiology Dg Chest 2 View  Result Date: 11/05/2017 CLINICAL DATA:  Right-sided chest pain EXAM: CHEST - 2 VIEW COMPARISON:  02/26/2017 FINDINGS: The heart size and mediastinal contours are within normal limits. Both lungs are clear. The visualized skeletal structures are unremarkable. IMPRESSION: Clear lungs. Electronically Signed   By: Deatra Robinson M.D.   On: 11/05/2017 21:25   Ct Angio Chest Pe W/cm &/or Wo Cm  Result Date: 11/05/2017 CLINICAL DATA:  Right-sided chest pain EXAM: CT ANGIOGRAPHY CHEST WITH CONTRAST TECHNIQUE: Multidetector CT imaging of the chest was performed using the standard protocol during bolus administration of intravenous contrast. Multiplanar CT image reconstructions and MIPs were obtained to evaluate the vascular anatomy. CONTRAST:  80mL ISOVUE-370 IOPAMIDOL (ISOVUE-370) INJECTION 76% COMPARISON:  Chest CT 11/21/2014 FINDINGS: Cardiovascular: --Pulmonary arteries: Contrast injection is sufficient to demonstrate satisfactory opacification of the pulmonary arteries to the segmental level. There is no pulmonary embolus. The main pulmonary artery is within normal limits  for size. --Aorta: Limited opacification of the aorta due to bolus timing optimization for the pulmonary arteries. Conventional 3 vessel aortic branching pattern. The aortic course and caliber are normal. There is moderate aortic atherosclerosis. --Heart: Mild cardiomegaly.  Trace pericardial effusion. Mediastinum/Nodes: No mediastinal, hilar or axillary lymphadenopathy. The visualized thyroid and thoracic esophageal course are unremarkable. Lungs/Pleura: Bilateral basilar predominant fibrosis. No pulmonary nodules or masses. No pleural effusion or pneumothorax. No focal airspace consolidation. No focal pleural abnormality. Upper Abdomen: Contrast bolus timing is not optimized for evaluation of the abdominal organs. Incompletely visualized exophytic lesion projecting superiorly from the left kidney measuring 2 cm with intermediate attenuation. This was previously assessed with MRI on 05/29/2016. Musculoskeletal: No chest wall abnormality. No acute or significant osseous findings. Review of the MIP images confirms the above findings. IMPRESSION: 1. No acute pulmonary embolus. 2. Bibasilar predominant subpleural fibrosis. 3. Trace pericardial effusion and mild cardiomegaly. Aortic Atherosclerosis (ICD10-I70.0). Electronically Signed   By: Deatra Robinson M.D.   On: 11/05/2017 22:55    Procedures Procedures (including critical care time)  Medications Ordered in ED Medications  morphine 4 MG/ML injection 4 mg (has no administration in time range)     Initial Impression / Assessment and Plan / ED Course  I have reviewed the triage vital signs and the nursing notes.  Pertinent labs & imaging results that were available during my care of the patient were reviewed by me and considered in my medical decision making (see chart for details).  Clinical Course as of Nov 06 957  Fri Nov 05, 2017  2303 Patient CT did not show an obvious cause of her symptoms.  They did comment upon some pulmonary fibrosis which  sounds new to the patient and her family but does not really explain her symptoms.  They ultimately will need to follow-up with her primary care doctor for continued work-up of her complaint.   [MB]    Clinical Course User Index [MB] Terrilee Files, MD     Final Clinical Impressions(s) / ED Diagnoses   Final diagnoses:  Acute right-sided thoracic back pain  Right-sided chest pain    ED Discharge Orders    None       Terrilee Files, MD 11/06/17 1000

## 2019-09-17 ENCOUNTER — Emergency Department (HOSPITAL_BASED_OUTPATIENT_CLINIC_OR_DEPARTMENT_OTHER)
Admission: EM | Admit: 2019-09-17 | Discharge: 2019-09-17 | Disposition: A | Discharge status: Home / Self Care | Payer: Medicare PPO | Attending: Emergency Medicine | Admitting: Emergency Medicine

## 2019-09-17 ENCOUNTER — Encounter (HOSPITAL_BASED_OUTPATIENT_CLINIC_OR_DEPARTMENT_OTHER): Payer: Self-pay | Admitting: Emergency Medicine

## 2019-09-17 ENCOUNTER — Other Ambulatory Visit: Payer: Self-pay

## 2019-09-17 DIAGNOSIS — I10 Essential (primary) hypertension: Secondary | ICD-10-CM | POA: Insufficient documentation

## 2019-09-17 DIAGNOSIS — E1165 Type 2 diabetes mellitus with hyperglycemia: Secondary | ICD-10-CM | POA: Diagnosis not present

## 2019-09-17 DIAGNOSIS — Y92410 Unspecified street and highway as the place of occurrence of the external cause: Secondary | ICD-10-CM | POA: Insufficient documentation

## 2019-09-17 DIAGNOSIS — Z041 Encounter for examination and observation following transport accident: Secondary | ICD-10-CM | POA: Diagnosis present

## 2019-09-17 DIAGNOSIS — G309 Alzheimer's disease, unspecified: Secondary | ICD-10-CM | POA: Insufficient documentation

## 2019-09-17 DIAGNOSIS — Y999 Unspecified external cause status: Secondary | ICD-10-CM | POA: Insufficient documentation

## 2019-09-17 DIAGNOSIS — Y9389 Activity, other specified: Secondary | ICD-10-CM | POA: Diagnosis not present

## 2019-09-17 DIAGNOSIS — R739 Hyperglycemia, unspecified: Secondary | ICD-10-CM

## 2019-09-17 LAB — CBG MONITORING, ED
Glucose-Capillary: 369 mg/dL — ABNORMAL HIGH (ref 70–99)
Glucose-Capillary: 371 mg/dL — ABNORMAL HIGH (ref 70–99)
Glucose-Capillary: 305 mg/dL — ABNORMAL HIGH (ref 70–99)

## 2019-09-17 MED ORDER — SODIUM CHLORIDE 0.9 % IV BOLUS
500.0000 mL | Freq: Once | INTRAVENOUS | Status: AC
  Administered 2019-09-17: 500 mL via INTRAVENOUS
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
## 2019-09-17 NOTE — Discharge Instructions (Signed)
Take Tylenol as prescribed as needed for pain.  Do not be surprised if you wake up tomorrow feeling a little bit more sore than you do today.  I typically expect soreness to last up to 1 week.  You can take hot showers and hot baths and do some gentle stretching or apply ice or heat, 20 minutes on 20 minutes off, whichever feels best.  Try not to stay in 1 position for too long to avoid muscle stiffness.  Your blood sugars today were pretty elevated but improved with fluids and rest.  Continue to monitor your blood sugars at home and avoid high sugar and high salt foods.  Your blood pressure today was also elevated.  Continue to monitor these at home and touch base with the PCP or cardiologist to discuss medication management.  Return to the emergency department if any concerning signs or symptoms develop such as high fevers, persistent vomiting, weakness, chest pain, shortness of breath, loss of consciousness.
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
## 2019-09-17 NOTE — ED Notes (Signed)
Pt.s CBG had to be checked. Pts CBG was 371. Cindy RN notified.

## 2021-04-03 ENCOUNTER — Emergency Department (HOSPITAL_BASED_OUTPATIENT_CLINIC_OR_DEPARTMENT_OTHER): Payer: Medicare PPO

## 2021-04-03 ENCOUNTER — Encounter (HOSPITAL_BASED_OUTPATIENT_CLINIC_OR_DEPARTMENT_OTHER): Payer: Self-pay

## 2021-04-03 ENCOUNTER — Emergency Department (HOSPITAL_BASED_OUTPATIENT_CLINIC_OR_DEPARTMENT_OTHER)
Admission: EM | Admit: 2021-04-03 | Discharge: 2021-04-03 | Disposition: A | Discharge status: Home / Self Care | Payer: Medicare PPO | Attending: Emergency Medicine | Admitting: Emergency Medicine

## 2021-04-03 ENCOUNTER — Other Ambulatory Visit: Payer: Self-pay

## 2021-04-03 DIAGNOSIS — Z23 Encounter for immunization: Secondary | ICD-10-CM | POA: Insufficient documentation

## 2021-04-03 DIAGNOSIS — Z7984 Long term (current) use of oral hypoglycemic drugs: Secondary | ICD-10-CM | POA: Diagnosis not present

## 2021-04-03 DIAGNOSIS — Z7982 Long term (current) use of aspirin: Secondary | ICD-10-CM | POA: Insufficient documentation

## 2021-04-03 DIAGNOSIS — M79632 Pain in left forearm: Secondary | ICD-10-CM

## 2021-04-03 DIAGNOSIS — G309 Alzheimer's disease, unspecified: Secondary | ICD-10-CM | POA: Diagnosis not present

## 2021-04-03 DIAGNOSIS — W2203XA Walked into furniture, initial encounter: Secondary | ICD-10-CM | POA: Insufficient documentation

## 2021-04-03 DIAGNOSIS — W19XXXA Unspecified fall, initial encounter: Secondary | ICD-10-CM

## 2021-04-03 DIAGNOSIS — Z79899 Other long term (current) drug therapy: Secondary | ICD-10-CM | POA: Diagnosis not present

## 2021-04-03 DIAGNOSIS — R9389 Abnormal findings on diagnostic imaging of other specified body structures: Secondary | ICD-10-CM

## 2021-04-03 MED ORDER — TETANUS-DIPHTH-ACELL PERTUSSIS 5-2.5-18.5 LF-MCG/0.5 IM SUSY
0.5000 mL | PREFILLED_SYRINGE | Freq: Once | INTRAMUSCULAR | Status: AC
  Administered 2021-04-03: 0.5 mL via INTRAMUSCULAR
  Filled 2021-04-03: qty 0.5

## 2021-04-03 NOTE — ED Provider Notes (Signed)
[facility] EMERGENCY DEPARTMENT Provider Note   CSN: [id number] Arrival date & time: 04/03/21  [id number]     History  Chief Complaint  Patient presents with   Shannon Ballard    Shannon Ballard is a 76 y.o. female.  Patient is a 76 yo female with pmh of alzheimer's disease presenting for arm pain after fall. Pt states she lives at home with her caregiver-grand daughter. States she fell out of bed this morning. Denies head trauma. Denies spinal pain. Admits to left forearm pain after hitting it on the dresser. Denies sensation or  motor deficits. Pt currently Aox2. Patient's friend at bedside states she is not acting like her self stating "it's like she took a bunch of medicines or something".   Fall Pertinent negatives include no chest pain, no abdominal pain and no shortness of breath.      Home Medications Prior to Admission medications   Medication Sig Start Date End Date Taking? Authorizing Provider  amLODipine (NORVASC) 10 MG tablet Take 10 mg by mouth daily.    [provider]  aspirin 81 MG tablet Take 81 mg by mouth daily.    [provider]  atorvastatin (LIPITOR) 40 MG tablet Take 40 mg by mouth daily.    [provider]  HYDROcodone-acetaminophen (NORCO) 5-325 MG tablet Take 1 tablet by mouth every 4 (four) hours as needed for severe pain. 07/07/15   Sherwood Gambler, MD  ibuprofen (ADVIL,MOTRIN) 600 MG tablet Take 1 tablet (600 mg total) by mouth every 6 (six) hours as needed. 01/09/14   Veryl Speak, MD  levothyroxine (SYNTHROID, LEVOTHROID) 137 MCG tablet Take 137 mcg by mouth daily before breakfast.    [provider]  LORazepam (ATIVAN) 1 MG tablet Take 0.5 tablets (0.5 mg total) by mouth 3 (three) times daily as needed for anxiety. 02/05/17   Forde Dandy, MD  losartan (COZAAR) 25 MG tablet Take 25 mg by mouth daily.    [provider]  metFORMIN (GLUCOPHAGE) 1000 MG tablet Take 1,000 mg by mouth 2 (two) times daily with a  meal.    [provider]  Multiple Vitamin (MULTIVITAMIN) tablet Take 1 tablet by mouth daily.    [provider]  potassium chloride SA (K-DUR,KLOR-CON) 20 MEQ tablet Take 2 tablets (40 mEq total) by mouth daily. 01/04/16 01/07/16  Duffy Bruce, MD  sitaGLIPtin-metformin (JANUMET) 50-1000 MG tablet Take 1 tablet by mouth 2 (two) times daily with a meal.    [provider]      Allergies    Celebrex [celecoxib], Lisinopril, Meloxicam, and Penicillins    Review of Systems   Review of Systems  Constitutional:  Negative for chills and fever.  HENT:  Negative for ear pain and sore throat.   Eyes:  Negative for pain and visual disturbance.  Respiratory:  Negative for cough and shortness of breath.   Cardiovascular:  Negative for chest pain and palpitations.  Gastrointestinal:  Negative for abdominal pain and vomiting.  Genitourinary:  Negative for dysuria and hematuria.  Musculoskeletal:  Negative for arthralgias and back pain.  Skin:  Positive for wound. Negative for color change and rash.  Neurological:  Negative for seizures and syncope.  Psychiatric/Behavioral:  Positive for confusion.   All other systems reviewed and are negative.  Physical Exam Updated Vital Signs BP (!) 221/86 (BP Location: Right Arm)    Pulse 61    Temp 98.2 F (36.8 C)    Resp 17    Ht 5'  9" (1.753 m)    Wt 94.1 kg    SpO2 99%    BMI 30.63 kg/m  Physical Exam Vitals and nursing note reviewed.  Constitutional:      General: She is not in acute distress.    Appearance: She is well-developed.  HENT:     Head: Normocephalic and atraumatic.  Eyes:     Conjunctiva/sclera: Conjunctivae normal.  Cardiovascular:     Rate and Rhythm: Normal rate and regular rhythm.     Heart sounds: No murmur heard. Pulmonary:     Effort: Pulmonary effort is normal. No respiratory distress.     Breath sounds: Normal breath sounds.  Abdominal:     Palpations: Abdomen is soft.     Tenderness: There  is no abdominal tenderness.  Musculoskeletal:        General: No swelling.     Right shoulder: Normal.     Left shoulder: Normal.     Right upper arm: Normal.     Left upper arm: Normal.     Right elbow: Normal.     Left elbow: Normal.     Right forearm: Normal.     Left forearm: Swelling, tenderness and bony tenderness present.     Right wrist: Normal. Normal pulse.     Left wrist: Normal. Normal pulse.     Cervical back: Neck supple. No bony tenderness.     Thoracic back: No bony tenderness.     Lumbar back: No bony tenderness.     Right hip: No bony tenderness.     Left hip: No bony tenderness.  Skin:    General: Skin is warm and dry.     Capillary Refill: Capillary refill takes less than 2 seconds.     Findings: Laceration present.       Neurological:     Mental Status: She is alert.     GCS: GCS eye subscore is 4. GCS verbal subscore is 5. GCS motor subscore is 6.     Cranial Nerves: Cranial nerves 2-12 are intact.     Sensory: Sensation is intact.     Motor: Motor function is intact.     Coordination: Coordination is intact.     Comments: AOx2  Psychiatric:        Mood and Affect: Mood normal.    ED Results / Procedures / Treatments   Labs (all labs ordered are listed, but only abnormal results are displayed) Labs Reviewed - No data to display  EKG None  Radiology No results found.  Procedures Procedures    Medications Ordered in ED Medications - No data to display  ED Course/ Medical Decision Making/ A&P                           Medical Decision Making Amount and/or Complexity of Data Reviewed Radiology: ordered.   7:29 AM 76 yo female with pmh of alzheimer's disease presenting for arm pain after fall. Patient is Aox2, no acute distress, stable vitals. No neurovascular deficits. CT head demonstrates no acute process.  Pt has pain in left forearm with superficial laceration. Tdap given. Pt declining pain medication at this time. Xray reviewed,  my interpretation of xray demonstrates no fracture. Abnormal finding of osseous structure concerning for malignancy with radiologist recommendations for f/u MRI. Pt given printed version of findings and recommended to f/u with pcp for imaging in the next 2 weeks.   Fall prevention discussed.  Patient in  no distress and overall condition improved here in the ED. Detailed discussions were had with the patient regarding current findings, and need for close f/u with PCP or on call doctor. The patient has been instructed to return immediately if the symptoms worsen in any way for re-evaluation. Patient verbalized understanding and is in agreement with current care plan. All questions answered prior to discharge.         Final Clinical Impression(s) / ED Diagnoses Final diagnoses:  Fall, initial encounter  Left forearm pain  Abnormal x-ray    Rx / DC Orders ED Discharge Orders     None         Lianne Cure, DO [id number]

## 2021-04-03 NOTE — ED Triage Notes (Signed)
Pt presents to ed with c/o left arm pain after accidentally falling out of her bed. Pt denies loc, no blood thinners. Pt has hx of Alzheimer's, she oriented to name, dob, and situation. Friend of patient states this is her baseline. Pt c/o pain to left forearm. Denies any other coplaints.

## 2021-04-03 NOTE — Discharge Instructions (Addendum)
Xray reviewed, my interpretation of xray demonstrates no fracture. Abnormal finding of osseous structure concerning for malignancy with radiologist recommendations for f/u MRI. Recommended to f/u with pcp for imaging in the next 2 weeks.

## 2022-08-11 IMAGING — CT CT HEAD W/O CM
3 series · 15 of 47 positions shown, 18 images · non-contrast
Comparison: MR 06/01/2017

CLINICAL DATA: Trauma post fall from bed



[Series 2: head wo · axial · 0.44mm/px · z∈[-195,-65]mm · 9 of 32 slices shown, 12 images]
[im 3/32  brain]
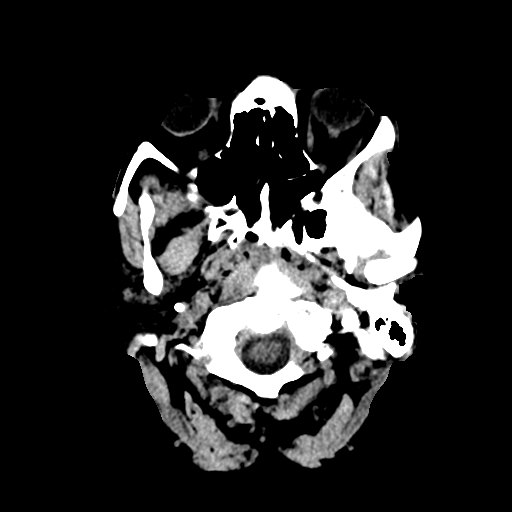
[im 3/32  bone]
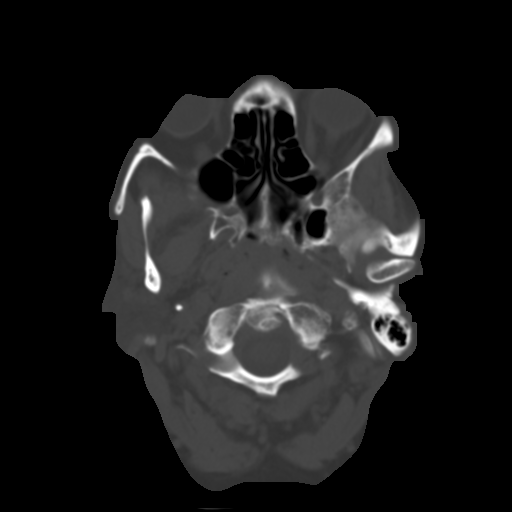
[im 6/32  brain]
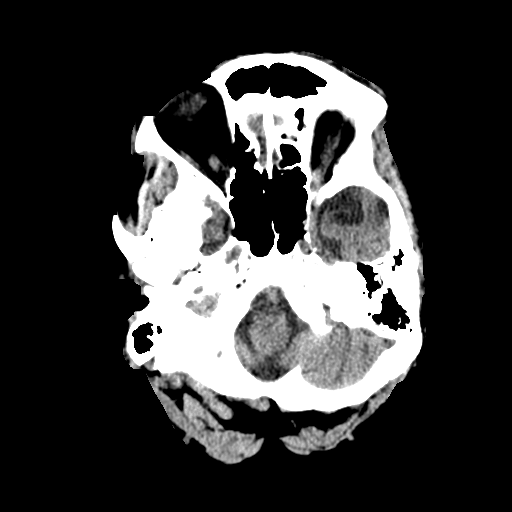
[im 9/32  brain]
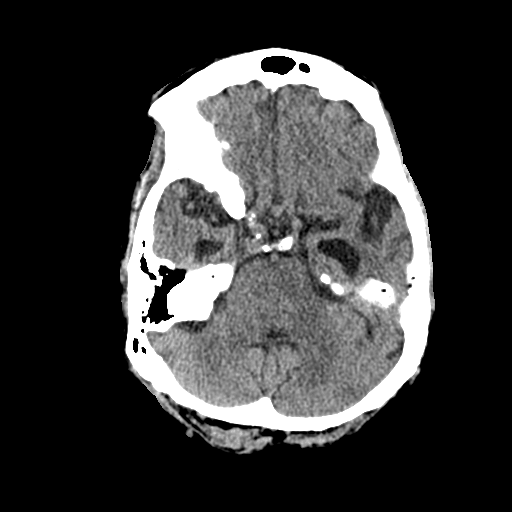
[im 12/32  brain]
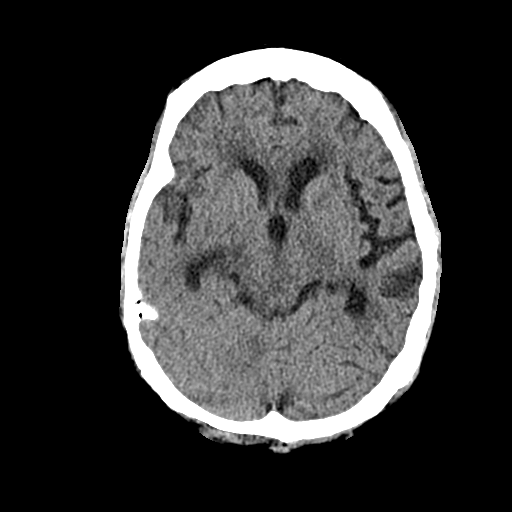
[im 17/32  brain]
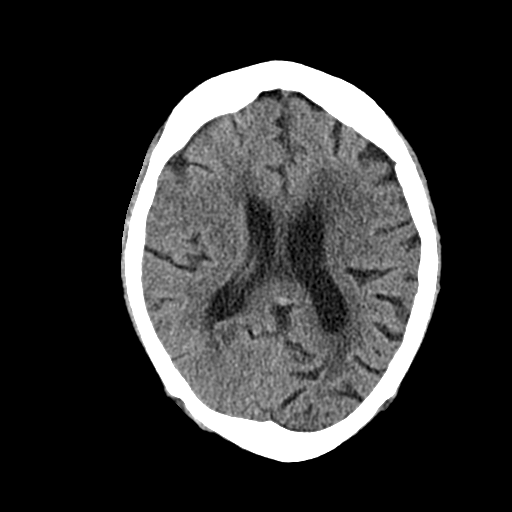
[im 17/32  bone]
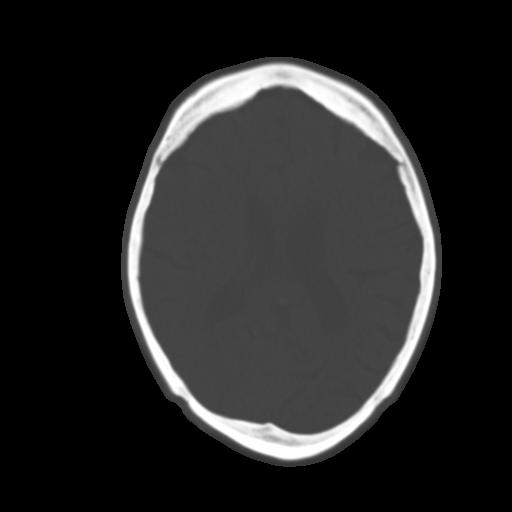
[im 20/32  brain]
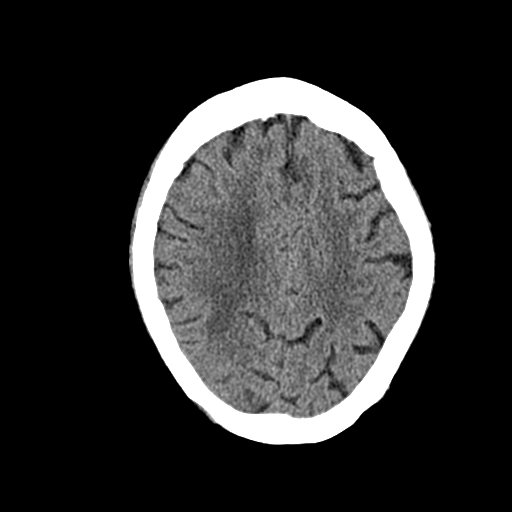
[im 23/32  brain]
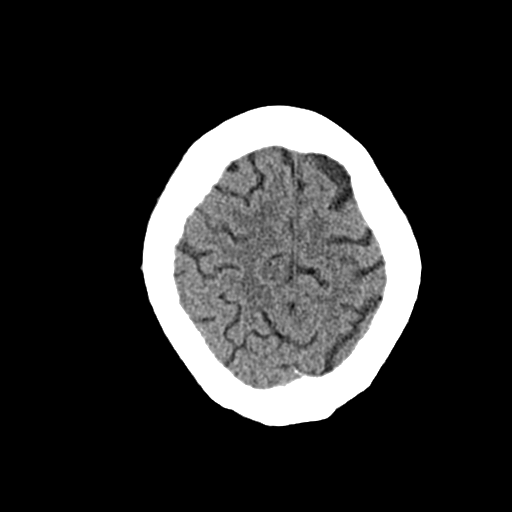
[im 26/32  brain]
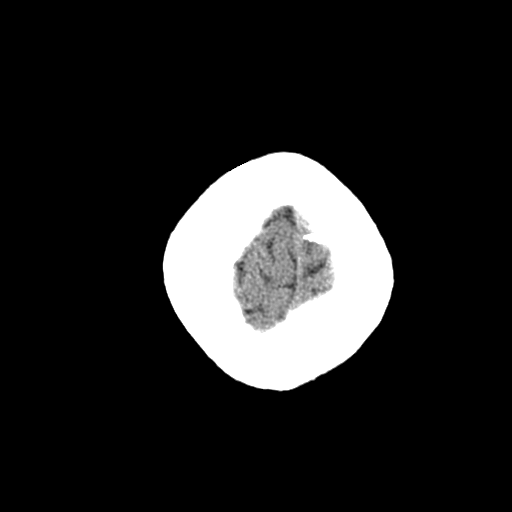
[im 29/32  brain]
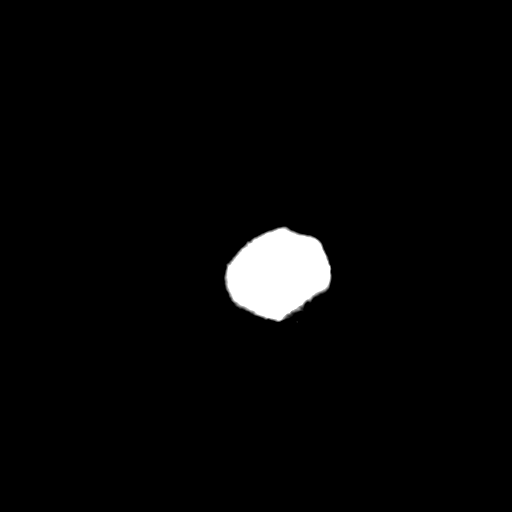
[im 29/32  bone]
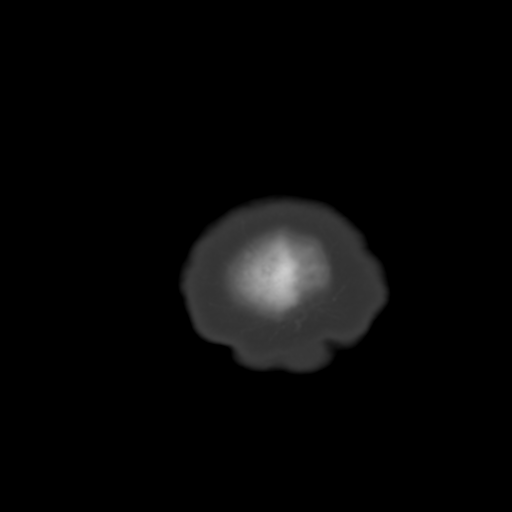

[Series 4: cor soft · coronal · 0.32mm/px · 3 of 64 slices shown]
[im 22/64  brain]
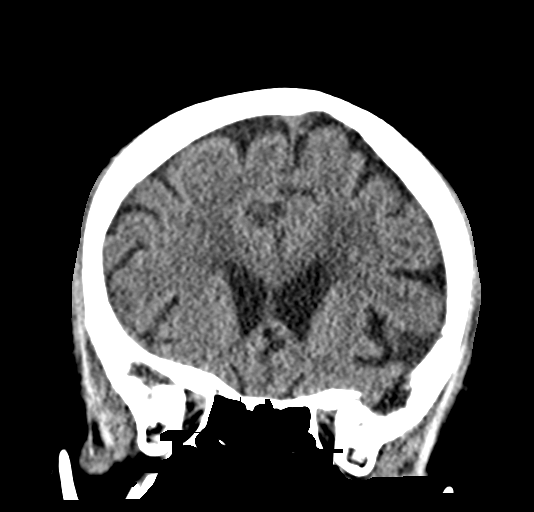
[im 29/64  brain]
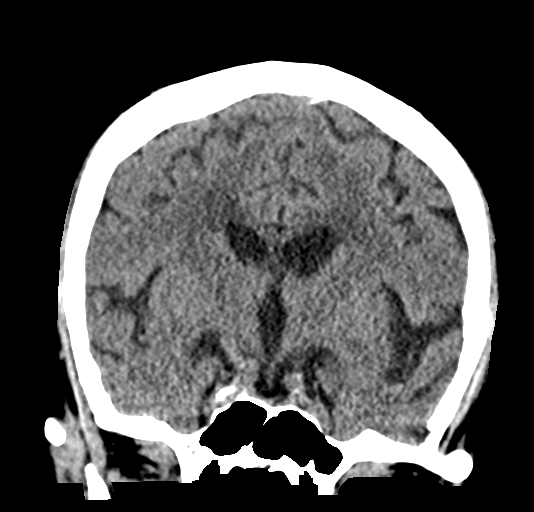
[im 36/64  brain]
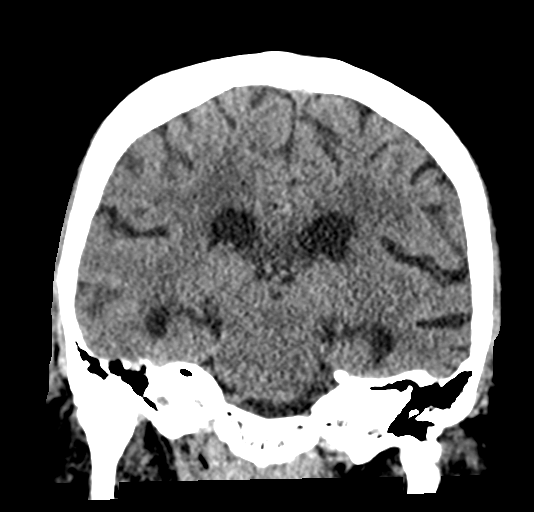

[Series 5: sag soft · sagittal · 0.32mm/px · 3 of 52 slices shown]
[im 18/52  brain]
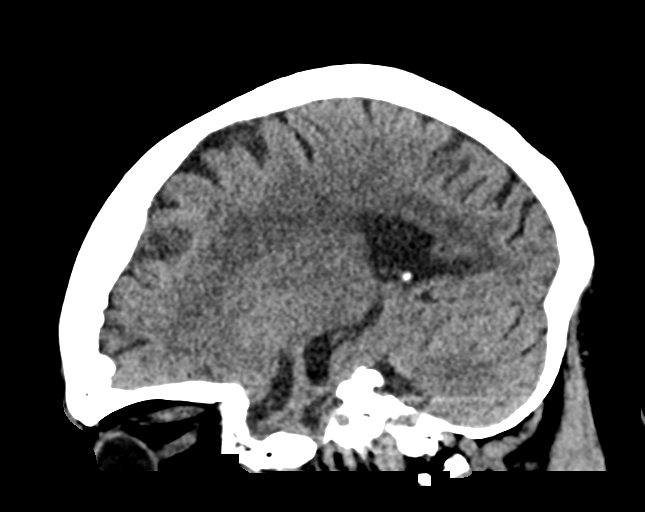
[im 26/52  brain]
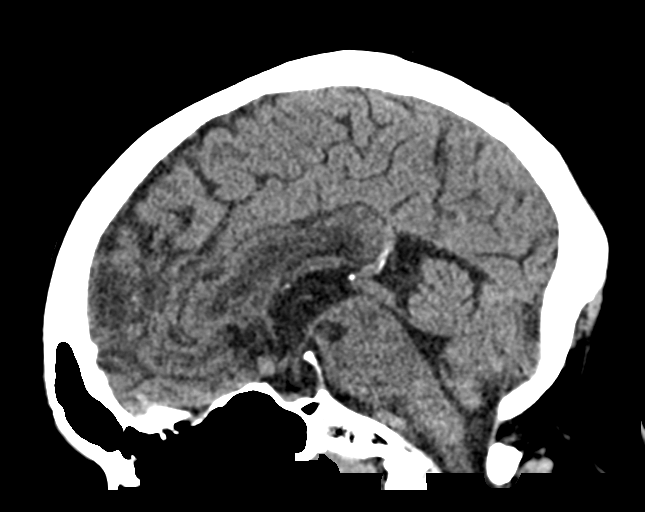
[im 35/52  brain]
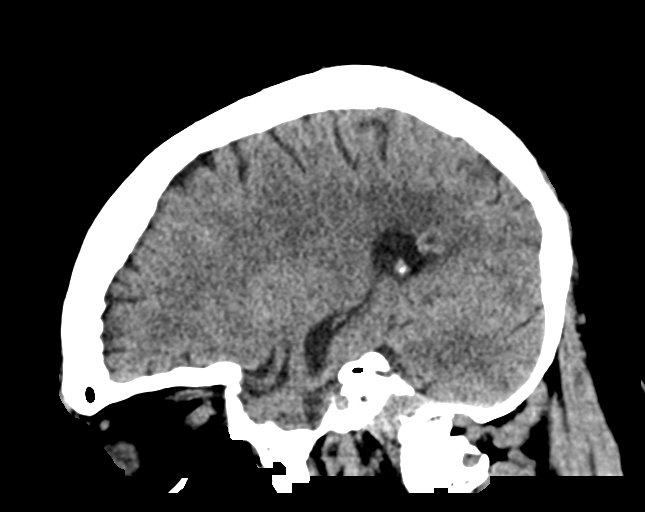

[15 of 47 positions shown; findings below may reference images not displayed]

FINDINGS: Brain: Patchy areas of hypoattenuation in deep and periventricular
white matter bilaterally. Negative for acute intracranial
hemorrhage, mass lesion, acute infarction, midline shift, or
mass-effect. Acute infarct may be inapparent on noncontrast CT.
Ventricles and sulci symmetric.

Vascular: Atherosclerotic and physiologic intracranial
calcifications.

Skull: Normal. Negative for fracture or focal lesion.

Sinuses/Orbits: No acute finding.

Other: None
IMPRESSION: 1. Negative for bleed or other acute intracranial process.
2. Mild bilateral nonspecific white matter changes.

## 2023-12-20 NOTE — Progress Notes (Signed)
 Follow Up Telehealth Note 12/20/2023  Todays visit was completed via a real-time telehealth encounter while the patient is in [place]. A telehealth visit was utilized in order to decrease the patient's potential exposure to COVID-19 vs an in-person visit. The patient/authorized person was informed of the potential benefits, limitations, and risks of telemedicine, expressed understanding that the laws that protect confidentiality also apply to telemedicine, and provided oral consent at the time of the visit to engaging in a telemedicine encounter with the present provider at [facility].   Telehealth Modality: Phone visit (audio/video) Total time spent in the clinical discussion 8 minutes.  Reason for Visit  discuss lab results  \\lcm/**$40/ccw**  Assessment  1) Dementia.  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 Moderate-severe severity.  A - T+ N-profile.  We reviewed diagnostic considerations, treatment concepts and options as well as prognosis.  Recommended continued efforts at salubrious cognitive and physical activities.  Will continue with Exelon 4.5 mg regimen for the time being.  May wish to explore more gradual titration to 6 mg dose or possible introduction of Exelon patch. Instructed to call with any undue effect from any treatment options. Last MRI brain 06/01/2017.  Last neuropsych testing 05/31/2017.  Last MoCA 7/30 on 07/18/2021.  Last CT head 04/03/2021.    Next: Exelon patch  Plan  1) RTC in 6 months. 2) Proceed with Exelon 6 mg alternating with 4.5mg  trial.   I plan to see Shannon Ballard back for No follow-ups on file.  She understands to call me for any questions or concerns.   ___________ L. Charlyne, MD Certifications in Neurology, Clinical Neurophysiology, Neuroimaging  HPI  Shannon Ballard is a 78 y.o. BF with dementia.  She is accompanied by her granddaughter [name] who is her primary caregiver.  Last visit the patient was recommended to titrate  her Exelon from 4.5 mg up to 6 mg twice daily  Additional ATN profile was drawn on 11/17/2023 and positive for elevation of phosphorylated tau 181 only.  Sleep: Okay Parasomnias:  Hallucinations/delusions:  Appetite: OK Dietary restrictions: Changes in smell/taste:  Insight: Limited Short-term memory worsening:  Misplacing items: Conversational amnesia:  Word finding difficulty: Behavioral changes:  ADL assist:  Medication adherence: Yes, supervised  MRI brain 06/01/2017.  Mild diffuse chronic microvascular ischemic changes.  Mild diffuse cortical atrophy with bitemporal predilection.  Last MoCA on 07/18/21 with score of 7/30.  MRI brain 06/01/2017.  Mild diffuse chronic microvascular ischemic changes.  Mild diffuse cortical atrophy with bitemporal predilection.  Exam  There were no vitals taken for this visit.  NAD. Afebrile. Cooperative with examiner. Lids normal.  Anicteric. No audible wheeze. No accessory respiratory muscle use.   Language intact.  EOMI. Face symmetric. Phonation intact.  No tremor seen.   PM/SH   Allergies  Allergen Reactions   Celecoxib Anaphylaxis   Donepezil Other (See Comments)    Vivid dreams/nightmares   Lisinopril Cough   Meloxicam Swelling   Penicillin Other (See Comments)    Childhood reaction   Current Outpatient Medications  Medication Sig Dispense Refill   Accu-Chek Guide test strips test strip USE TO TEST BLOOD SUGAR ONCE DAILY 100 strip 3   albuterol HFA (PROVENTIL HFA;VENTOLIN HFA;PROAIR HFA) 90 mcg/actuation inhaler Inhale 2 puffs every 6 (six) hours as needed for wheezing. 1 each 11   amLODIPine (NORVASC) 5 mg tablet Take 1 tablet (5 mg total) by mouth daily. 90 tablet 3   aspirin 81 mg EC tablet Take 81 mg by  mouth Once Daily. 1 tablet 0   atorvastatin (LIPITOR) 40 mg tablet TAKE 1 TABLET(40 MG) BY MOUTH DAILY 90 tablet 3   cyanocobalamin (VITAMIN B12) 1,000 mcg tablet Take 1,000 mcg by mouth Once Daily. 90 tablet 3    ergocalciferol (VITAMIN D2) 1,250 mcg (50,000 unit) capsule TAKE 1 CAPSULE BY MOUTH 1 TIME A WEEK 13 capsule 4   escitalopram (LEXAPRO) 20 mg tablet TAKE 1 TABLET(20 MG) BY MOUTH DAILY 90 tablet 1   ferrous sulfate 325 mg (65 mg iron) EC tablet Take 1 tablet (325 mg total) by mouth daily with breakfast. 90 tablet 6   fluticasone furoate-vilanteroL (BREO ELLIPTA) 100-25 mcg/dose inhaler Inhale 1 puff daily. 1 each 11   freestyle 28 gauge misc 1 Stick by miscellaneous route Once Daily. 100 each 3   glipiZIDE (GLUCOTROL) 5 mg tablet Take 1 tablet (5 mg total) by mouth in the morning and 1 tablet (5 mg total) in the evening. Take with meals. Do all this for 7 days. 30 tablet 0   glucose monitoring kit kit Check blood sugar readings daily 1 each 1   lancets 30 gauge misc 1 Stick by miscellaneous route Once Daily. 100 each 3   levothyroxine (SYNTHROID) 137 mcg tablet Take 137 mcg by mouth every morning.     losartan (COZAAR) 50 mg tablet TAKE 1 TABLET(50 MG) BY MOUTH DAILY 90 tablet 3   memantine (NAMENDA) 10 mg tablet TAKE 1 TABLET BY MOUTH TWICE DAILY 180 tablet 2   mirabegron (MYRBETRIQ) 25 mg Tb24 TAKE 1 TABLET BY MOUTH DAILY 30 tablet 11   pioglitazone (ACTOS) 30 mg tablet Take 1 tablet (30 mg total) by mouth daily. 90 tablet 3   rivastigmine (EXELON) 4.5 mg capsule TAKE 1 CAPSULE(4.5 MG) BY MOUTH TWICE DAILY 30 capsule 2   Rybelsus 7 mg tab tablet TAKE 1 TABLET(7 MG) BY MOUTH DAILY BEFORE BREAKFAST 90 tablet 1   No current facility-administered medications for this visit.   Past Medical History:  Diagnosis Date   Acid reflux disease    Acute prerenal azotemia    Acute renal failure superimposed on chronic kidney disease    Anemia due to stage 3 chronic kidney disease (CMD)    Anemia of chronic disease    Benign hypertension with chronic kidney disease, stage III (CMD)    Cerebrovascular disease    Cervical spine degeneration    Degenerative cervical disc     Diabetes mellitus with stage 3 chronic kidney disease    (CMD)    Episodic lightheadedness    Generalized anxiety disorder    Hyperlipidemia LDL goal <100    Hypomagnesemia    IPF (idiopathic pulmonary fibrosis)    (CMD)    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, with behavioral disturbance (CMD)    Nephrotic range proteinuria    Nontoxic multinodular goiter    Normocytic normochromic anemia    Obesity due to excess calories with serious comorbidity    Orthostatic hypotension    Postsurgical hypothyroidism    Primary osteoarthritis involving multiple joints    Pure hypercholesterolemia    Recurrent UTI (urinary tract infection)    Renal cysts, acquired, bilateral    Type 2 diabetes mellitus with other diabetic kidney complication    (CMD)    Urinary incontinence    Vaginal atrophy    Vitamin D deficiency    Past Surgical History:  Procedure Laterality Date   CHOLECYSTECTOMY     Procedure: CHOLECYSTECTOMY  COLONOSCOPY N/A 02/22/2020   Procedure: COLONOSCOPY;  Surgeon: Gunnar Chris Daniels, MD;  Location: [facility];  Service: Gastroenterology;  Laterality: N/A;   FOOT SURGERY Right    Procedure: FOOT SURGERY   TOTAL THYROIDECTOMY     Procedure: TOTAL THYROIDECTOMY   Family History  Problem Relation Name Age of Onset   Dementia Mother     Depression Mother     Neuropathy Sister     Anxiety disorder Neg Hx     Ataxia Neg Hx     Chorea Neg Hx     Headaches Neg Hx     Migraines Neg Hx     Intellectual Disability Neg Hx     Multiple sclerosis Neg Hx     Neurofibromatosis Neg Hx     Parkinsonism Neg Hx     Seizures Neg Hx     Stroke Neg Hx     Breast cancer Neg Hx      LAB DATA   Lab Results  Component Value Date   WBC 5.70 11/16/2023   HGB 10.3 (L) 11/16/2023   HCT 30.6 (L) 11/16/2023   PLT 200 11/16/2023   CHOL 108 04/26/2023   TRIG 57 04/26/2023   HDL 39 (L) 04/26/2023   LDLDIRECT 50 04/09/2022    ALT 11 04/26/2023   AST 14 04/26/2023   NA 141 11/16/2023   K 4.1 11/16/2023   CL 104 11/16/2023   CREATININE 1.60 (H) 11/16/2023   BUN 23 11/16/2023   CO2 29 11/16/2023   TSH 1.989 07/22/2023   HGBA1C 8.8 (H) 11/16/2023   Lab on 11/17/2023  Component Date Value Ref Range Status   Color, Urine 11/17/2023 Yellow  Yellow Final   Clarity, Urine 11/17/2023 Cloudy (A)  Clear Final   Specific Gravity, Urine 11/17/2023 1.027 (H)  1.005 - 1.025 Final   pH, Urine 11/17/2023 6.0  5.0 - 8.0 Final   Protein, Urine 11/17/2023 20 (A)  Negative mg/dL Final   Glucose, Urine 11/17/2023 Negative  Negative mg/dL Final   Ketones, Urine 11/17/2023 Negative  Negative mg/dL Final   Bilirubin, Urine 11/17/2023 Negative  Negative Final   Blood, Urine 11/17/2023 Negative  Negative Final   Nitrite, Urine 11/17/2023 Negative  Negative Final   Leukocyte Esterase, Urine 11/17/2023 500 (A)  Negative, 25 Final   Urobilinogen, Urine 11/17/2023 Normal  <2.0 mg/dL Final   WBC, Urine 90/89/7974 13-20 (A)  <6 /HPF Final   RBC, Urine 11/17/2023 0-2  0 - 2 /HPF Final   Bacteria, Urine 11/17/2023 Rare  None Seen, Rare /HPF Final   Squamous Epithelial Cells, Urine 11/17/2023 11-20 (A)  0 - 5 /HPF Final   Renal Epithelial Cells, Urine 11/17/2023 0-2  0 - 2 /HPF Final   Urine Culture 11/17/2023 No uropathogens isolated.   Final    IMAGING DATA    ____________________  MDM  Orders No orders of the defined types were placed in this encounter.  No orders of the defined types were placed in this encounter.

## 2024-02-01 NOTE — Telephone Encounter (Signed)
 Ballard,Shannon on HIPAA.  Granddaughter reports, patients blood sugar has increased since her cortisone shot done today by [facility] Dr. Velinda Citron.   [name] is requesting for the provider to send in short supply of glipizide. She reports, a short supply was sent in, in the past following cortisone shot, stating patient blood sugar usually elevates following cortisone shot.    glipiZIDE (GLUCOTROL) 5 mg tablet [[id number]]  ENDED   Order Details <redacted file path> Dose: 5 mg Route: oral Frequency: 2 times daily with meals (Morning, Evening)  Dispense Quantity: 30 tablet (15 day supply) Refills: 0   Duration: 37 days Dispense As Written: No        Sig: Take 1 tablet (5 mg total) by mouth in the morning and 1 tablet (5 mg total) in the evening. Take with meals. Do all this for 7 days.       Start Date: 10/11/23 End Date: 11/17/23 after 14 doses  Written Date: 10/11/23 Expiration Date: 10/10/24     Associated Diagnoses: Type 2 diabetes mellitus with stage 3a chronic kidney disease, without long-term current use of insulin    (CMD) [E11.22, N18.31]

## 2024-02-01 NOTE — Telephone Encounter (Signed)
 LMTCB

## 2024-02-01 NOTE — Telephone Encounter (Signed)
 Copied from CRM #[id number]. Topic: Schedule Appointment - Schedule Patient >> Feb 01, 2024  9:44 AM Tully A wrote: lela is calling other request    Include all details related to the request(s) below:  Patient granddaughter would like for Aja the nurse to give her a call back ASAP    Confirm and type the Best Contact Number below:  Patient/caller contact number:     [phone]        [] Home  [x] Mobile  [] Work [] Other   [x] Okay to leave a voicemail   Medication List:  Current Outpatient Medications:    Accu-Chek Guide test strips test strip, USE TO TEST BLOOD SUGAR ONCE DAILY, Disp: 100 strip, Rfl: 3   albuterol HFA (PROVENTIL HFA;VENTOLIN HFA;PROAIR HFA) 90 mcg/actuation inhaler, Inhale 2 puffs every 6 (six) hours as needed for wheezing., Disp: 1 each, Rfl: 11   amLODIPine (NORVASC) 5 mg tablet, Take 1 tablet (5 mg total) by mouth daily., Disp: 90 tablet, Rfl: 3   aspirin 81 mg EC tablet, Take 81 mg by mouth Once Daily., Disp: 1 tablet, Rfl: 0   atorvastatin (LIPITOR) 40 mg tablet, TAKE 1 TABLET(40 MG) BY MOUTH DAILY, Disp: 90 tablet, Rfl: 3   cyanocobalamin (VITAMIN B12) 1,000 mcg tablet, Take 1,000 mcg by mouth Once Daily., Disp: 90 tablet, Rfl: 3   ergocalciferol (VITAMIN D2) 1,250 mcg (50,000 unit) capsule, TAKE 1 CAPSULE BY MOUTH 1 TIME A WEEK, Disp: 13 capsule, Rfl: 4   escitalopram (LEXAPRO) 20 mg tablet, TAKE 1 TABLET(20 MG) BY MOUTH DAILY, Disp: 90 tablet, Rfl: 1   ferrous sulfate 325 mg (65 mg iron) EC tablet, TAKE ONE TABLET BY MOUTH EVERY DAY WITH BREAKFAST, Disp: 90 tablet, Rfl: 3   fluticasone furoate-vilanteroL (BREO ELLIPTA) 100-25 mcg/dose inhaler, Inhale 1 puff daily., Disp: 1 each, Rfl: 11   freestyle 28 gauge misc, 1 Stick by miscellaneous route Once Daily., Disp: 100 each, Rfl: 3   glipiZIDE (GLUCOTROL) 5 mg tablet, Take 1 tablet (5 mg total) by mouth in the morning and 1 tablet (5 mg total) in the evening. Take with meals. Do all this for 7 days.,  Disp: 30 tablet, Rfl: 0   glucose monitoring kit kit, Check blood sugar readings daily, Disp: 1 each, Rfl: 1   lancets 30 gauge misc, 1 Stick by miscellaneous route Once Daily., Disp: 100 each, Rfl: 3   levothyroxine (SYNTHROID) 137 mcg tablet, Take 137 mcg by mouth every morning., Disp: , Rfl:    losartan (COZAAR) 50 mg tablet, TAKE 1 TABLET(50 MG) BY MOUTH DAILY, Disp: 90 tablet, Rfl: 3   memantine (NAMENDA) 10 mg tablet, TAKE 1 TABLET BY MOUTH TWICE DAILY, Disp: 180 tablet, Rfl: 2   mirabegron (MYRBETRIQ) 25 mg Tb24, TAKE 1 TABLET BY MOUTH DAILY, Disp: 30 tablet, Rfl: 11   pioglitazone (ACTOS) 30 mg tablet, TAKE 1 TABLET(30 MG) BY MOUTH DAILY, Disp: 90 tablet, Rfl: 1   rivastigmine (EXELON) 4.5 mg capsule, TAKE 1 CAPSULE(4.5 MG) BY MOUTH TWICE DAILY, Disp: 30 capsule, Rfl: 2   Rybelsus 7 mg tab tablet, TAKE 1 TABLET(7 MG) BY MOUTH DAILY BEFORE BREAKFAST, Disp: 90 tablet, Rfl: 1     Medication Request/Refills: Pharmacy Information (if applicable)   [] Not Applicable       []  Pharmacy listed  Send Medication Request to:                                                 []   Pharmacy not listed (added to pharmacy list in Epic) Send Medication Request to:      Listed Pharmacies: [id number] - [place], [place] - [address] AT [address] - PHONE: [phone] - FAX: [phone] [facility] #[id number] - [place], [place] - [address] AT [facility] OF [address] - PHONE: [phone] - FAX: [phone]

## 2024-02-01 NOTE — Telephone Encounter (Signed)
 Patient granddaughter is calling back in regarding patients prescription. Transferred to [facility].

## 2024-02-02 NOTE — Telephone Encounter (Signed)
 Patient grand daughter is calling requesting a call back, she is upset that no one has called

## 2024-02-11 NOTE — Telephone Encounter (Signed)
 S/w [name] (pt's grand daughter on HIPAA), and advised the medication was sent to the pharmacy the same day she called. She states she was getting the run around last week and wanted to s/w me directly. I explained I was out of the office for the holiday and apologized for any inconvenience. She states they picked up the medication and confirmed everything is fine.

## 2024-02-16 NOTE — Progress Notes (Signed)
 Subjective:  Patient ID: Shannon Ballard is a 78 y.o. female.  HPI: April Colter comes today for f/u of HTN w/CKD 3a, Diabetes, Hyperlipidemia, Acquired Hypothyroidism and GAD.SABRA She denies dysphagia, painful swallowing, N/V, abdominal pain, hematochezia or melena. No CP, SOB or leg pain with exertion. No PND, orthopnea, lightheadedness, presyncope or syncope. No wheezing, cough. No hypoglycemia. She has been checking sugars. She has no new complaints.  The following portions of the patient's history were reviewed and updated as appropriate: allergies, current medications, past family history, past medical history, past social history, past surgical history, and problem list.  Review of Systems  Constitutional:  Negative for chills, fatigue and fever.  HENT:  Negative for ear pain, hearing loss, sinus pressure and sore throat.   Eyes:  Negative for visual disturbance.  Respiratory:  Negative for chest tightness and shortness of breath.   Cardiovascular:  Negative for chest pain, palpitations and leg swelling.  Gastrointestinal:  Negative for abdominal pain, blood in stool, diarrhea, nausea and vomiting.  Endocrine: Negative for cold intolerance, heat intolerance, polydipsia, polyphagia and polyuria.  Genitourinary:  Negative for dysuria.  Musculoskeletal:  Negative for back pain and myalgias.  Skin:  Negative for rash.  Neurological:  Negative for dizziness, syncope, light-headedness and headaches.  Psychiatric/Behavioral:  Negative for behavioral problems.     Objective Physical Exam Vitals and nursing note reviewed.  Constitutional:      Appearance: Normal appearance.  HENT:     Head: Normocephalic and atraumatic.  Eyes:     Extraocular Movements: Extraocular movements intact.     Conjunctiva/sclera: Conjunctivae normal.     Pupils: Pupils are equal, round, and reactive to light.  Neck:     Vascular: No carotid bruit.  Cardiovascular:     Rate and Rhythm: Normal rate and regular  rhythm.     Heart sounds: Murmur (SE 2-3/6) heard.     No gallop.  Pulmonary:     Effort: Pulmonary effort is normal.     Breath sounds: Normal breath sounds. No wheezing or rhonchi.  Abdominal:     General: Bowel sounds are normal.     Palpations: Abdomen is soft.     Tenderness: There is no abdominal tenderness.  Musculoskeletal:        General: Normal range of motion.     Cervical back: Normal range of motion and neck supple. No rigidity or tenderness.     Right lower leg: No edema.     Left lower leg: No edema.  Lymphadenopathy:     Cervical: No cervical adenopathy.  Skin:    General: Skin is warm.     Findings: No erythema.  Neurological:     Mental Status: She is alert. Mental status is at baseline.  Psychiatric:        Mood and Affect: Mood normal.        Behavior: Behavior normal.      Assessment   1. Type 2 diabetes mellitus with stage 3a chronic kidney disease, without long-term current use of insulin    (CMD).  Controlled with Pioglitazone 30 mg every day, Rybelsus 7 mg every day and Glipizide 5 mg BID PRN when on steroids. UTD w/foot and eye exam.   PLAN:  Continue current treatment.  2. Benign hypertension with stage 3a chronic kidney disease (CMD) (Primary).  Slightly elevated. She is taking Amlodipine 5 mg every day and Losartan 50 mg every day.   PLAN: Continue current treatment. Low salt diet.  3. Hyperlipidemia  LDL goal <100.  Controlled with Atorvastatin 40 mg every day.  PLAN:  Continue current treatment.  4. Acquired hypothyroidism.  Controlled with Levothyroxine 137 mcg QAM.  PLAN:  Continue current treatment.  5. GAD (generalized anxiety disorder).  Controlled with Escitalopram 20 mg every day.  PLAN:  Continue current treatment.  6. Osteoporosis screening.   Due for bone density.  PLAN: -Dexa scan 1 or More    Follow-up in 3 months.   Plan See above.   This document serves as a record of services personally  performed by Dr. Delilah.  It was created on their behalf by Pinkey ONEIDA Neve, CMA, a trained medical scribe, and Certified Medical Assistant (CMA). During the course of documenting the history, physical exam and medical decision making, I was functioning as a stage manager. The creation of this record is the providers dictation and/or activities during the visit.  Electronically signed by Pinkey ONEIDA Neve, CMA 02/16/2024 9:55 AM

## 2024-02-25 ENCOUNTER — Encounter (HOSPITAL_COMMUNITY): Payer: Self-pay | Admitting: Orthopaedic Surgery

## 2024-02-25 ENCOUNTER — Other Ambulatory Visit: Payer: Self-pay

## 2024-02-25 ENCOUNTER — Other Ambulatory Visit: Payer: Self-pay | Admitting: Orthopaedic Surgery

## 2024-02-25 NOTE — Anesthesia Preprocedure Evaluation (Signed)
"                                    Anesthesia Evaluation    Airway        Dental   Pulmonary           Cardiovascular hypertension,      Neuro/Psych    GI/Hepatic   Endo/Other  diabetes    Renal/GU      Musculoskeletal   Abdominal   Peds  Hematology   Anesthesia Other Findings   Reproductive/Obstetrics                              Anesthesia Physical Anesthesia Plan  ASA:   Anesthesia Plan:    Post-op Pain Management:    Induction:   PONV Risk Score and Plan:   Airway Management Planned:   Additional Equipment:   Intra-op Plan:   Post-operative Plan:   Informed Consent:   Plan Discussed with:   Anesthesia Plan Comments: (PAT note written 02/25/2024 by Siana Panameno, PA-C.  )        Anesthesia Quick Evaluation  "

## 2024-02-25 NOTE — Progress Notes (Addendum)
 SDW call  Patient's Granddaughter [name] was given pre-op instructions over the phone. She verbalized understanding of instructions provided. She denied patient having any SOB, fever or cough   PCP - Dr. Murray Amos, [facility] Cardiologist - Denies Pulmonary: Dr. Deatrice Campanile, [facility] Neurologist:: Dr. Sharri Fortune, [facility] Nephrologist: Dr. Adetoye Lufadeju, [facility]   PPM/ICD - Denies Device Orders - na Rep Notified - na   Chest x-ray -  EKG -  DOS, 02/28/2024 Stress Test - ECHO -  Cardiac Cath -   Sleep Study/sleep apnea/CPAP: denies  Type II diabetic. A1C 8.8 on 02/16/2024 ([facility]) Fasting Blood sugar range: 130-160 How often check sugars: Daily Glipizide, Actos and Rybelsus; hold DOS  Blood Thinner Instructions: denies Aspirin Instructions:follow surgeon's instructions   ERAS Protcol - Clears until 9384   Anesthesia review: Yes. Alzeheimers, HTN, DM, heart murmur  Your procedure is scheduled on Monday February 28, 2024  Report to [facility] Main Entrance A at  0645  A.M., then check in with the Admitting office.  Call this number if you have problems the morning of surgery:  [phone]   If you have any questions prior to your surgery date call [phone]: Open Monday-Friday 8am-4pm If you experience any cold or flu symptoms such as cough, fever, chills, shortness of breath, etc. between now and your scheduled surgery, please notify us  at the above number    Remember:  Do not eat after midnight the night before your surgery  You may drink clear liquids until  0615   the morning of your surgery.   Clear liquids allowed are: Water, Non-Citrus Juices (without pulp), Carbonated Beverages, Clear Tea, Black Coffee ONLY (NO MILK, CREAM OR POWDERED CREAMER of any kind), and Gatorade   Take these medicines the morning of surgery with A SIP OF WATER:  Amlodipine, lexapro, Breo Ellipta, levothyroxine, nameda, exelon  As needed: tylenol   As of today, STOP taking any Aleve ,  Naproxen , Ibuprofen , Motrin , Advil , Goody's, BC's, all herbal medications, fish oil, and all vitamins.

## 2024-02-25 NOTE — Progress Notes (Signed)
 Anesthesia Chart Review: SAME DAY WORK-UP  Case: [id number] Date/Time: 02/28/24 0900   Procedure: OPEN REDUCTION INTERNAL FIXATION (ORIF) ANKLE FRACTURE (Right: Ankle)   Anesthesia type: General   Diagnosis: Closed displaced fracture of medial malleolus of right tibia, initial encounter [S82.51XA]   Pre-op diagnosis: CLOSED DISPLACED RIGHT MEDIAL MALLEOLUS FRACTURE   Location: [facility] ROOM 15 / [facility] OR   Surgeons: Elsa Lonni SAUNDERS, MD       DISCUSSION: Patient is an 78 year old female scheduled for the above procedure.  History includes never smoker, HTN, Alzheimer's disease, thyroidectomy with postsurgical hypothyroidism, murmur (trace MR/TR 01/02/2019), CKD (stage 3), anemia, idiopathic pulmonary fibrosis (09/22/2023 Dr. 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  A1c 8.8% at 02/16/2024 primary care follow-up. On Pioglitazone 30 mg every day, Rybelsus 7 mg every day and Glipizide 5 mg BID PRN when on steroids. UTD w/foot and eye exam.  Additional labs done on 02/16/2024 through [facility] (see [facility]) include WBC 7.9, hemoglobin 9.9, hematocrit 30.6, platelet count 204.  Renal function panel 11/16/2023 showed, sodium 141, potassium 4.1, glucose 273, BUN 23, creatinine 1.60 (previously 1.44 on 10/11/2023), eGFR 32, calcium 9.1, phosphorus 3.4.  PTH, intact was elevated at 141.  She is for updated labs on arrival as indicated.  She is a same-day workup.  Anesthesia team to evaluate on the day of surgery.   VS: 02/16/2024 ([facility]): BP 148/64, HR 70.  PROVIDERS: Delilah Murray HERO., MD is PCP  Tonuzi, Lirim, MD is neurologist Lufadeju, Adetoye, MD is nephrologist  Nathanael Grass, MD is pulmonologist. Per 09/22/2023 visit: -last PFTs reviewed ratio 69 showing signs of obstructive lung disease with bronchodilator response of 11%,  -- Last lung imaging chest x-ray February 25 of 2024, no active cardiopulmonary disease, coarsened  interstitial markings likely from underlying mild scarring. No recent respiratory issues or complications.  He renewed Breo and albuterol.  1 year follow-up planned.   LABS: See DISCUSSION.   IMAGES: CXR 05/04/2022 ([facility]): FINDINGS:  - Cardiomediastinal silhouette unchanged in size and contour. No evidence of central vascular congestion. No interlobular septal  thickening.  - No pneumothorax or pleural effusion. Coarsened interstitial  markings, with no confluent airspace disease.  - No acute displaced fracture. Degenerative changes of the spine.  IMPRESSION:  No active cardiopulmonary disease.   CT Chest 02/24/2019 ([facility]): IMPRESSION:  1. Limited motion degraded scan. 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 Apparent slight progression  since 2016 and 2019 chest CT studies. Differential remains usual  interstitial pneumonia (UIP) or fibrotic nonspecific interstitial  pneumonia (NSIP). Findings are categorized as probable UIP per  consensus guidelines: Diagnosis of Idiopathic Pulmonary Fibrosis: An  Official ATS/ERS/JRS/ALAT Clinical Practice Guideline. Am JINNY Honey  Crit Care Med Vol 198, Iss 5, [phone], Nov 07 2016.  2. Three-vessel coronary atherosclerosis.  - Aortic Atherosclerosis (ICD10-I70.0).    EKG: For day of surgery as indicated.  Last EKG noted is from 07/11/2019 with narrative in [facility] Everywhere indicating: Sinus rhythm with Premature atrial complexes  Moderate voltage criteria for LVH  Nonspecific ST and T wave abnormality  Confirmed by Launie Stanford  [id number]  on 07-11-2019 5 40 18 PM    CV: Echo 01/02/2019 ([facility]): The left ventricular size is normal.  Moderate left ventricular hypertrophy  Left ventricular systolic function is normal.  LV ejection fraction = 65-70%.  Left ventricular filling pattern is  prolonged relaxation.  The right ventricle is  normal size.  The right ventricular systolic function is normal.  The left atrium is mildly dilated.  There is aortic valve sclerosis.  No significant stenosis or regurgitation seen (mild to moderate MV thickening with trace MR, trace TR) There was insufficient TR detected to calculate RV systolic pressure.  Estimated right atrial pressure is 5 mmHg.SABRA  There is no pericardial effusion.  Unable to compare with prior TTE in 2006    Past Medical History:  Diagnosis Date   Alzheimer's disease (HCC)    Anemia    CKD (chronic kidney disease)    stage 3   Diabetes mellitus without complication (HCC)    Heart murmur    Hypertension    IPF (idiopathic pulmonary fibrosis) (HCC)    mild (pulmonologist Dr. Nathanael)   Panic attack    Thyroid  disease     Past Surgical History:  Procedure Laterality Date   CHOLECYSTECTOMY     THYROIDECTOMY      MEDICATIONS:  acetaminophen  (TYLENOL ) 650 MG CR tablet   amLODipine (NORVASC) 5 MG tablet   aspirin 81 MG tablet   atorvastatin (LIPITOR) 40 MG tablet   Cyanocobalamin (VITAMIN B-12 PO)   escitalopram (LEXAPRO) 20 MG tablet   ferrous sulfate 325 (65 FE) MG EC tablet   fluticasone (FLONASE) 50 MCG/ACT nasal spray   fluticasone furoate-vilanterol (BREO ELLIPTA) 100-25 MCG/ACT AEPB   levothyroxine (SYNTHROID, LEVOTHROID) 137 MCG tablet   losartan (COZAAR) 25 MG tablet   memantine (NAMENDA) 10 MG tablet   MYRBETRIQ 25 MG TB24 tablet   pioglitazone (ACTOS) 30 MG tablet   rivastigmine (EXELON) 4.5 MG capsule   RYBELSUS 7 MG TABS   Vitamin D, Ergocalciferol, (DRISDOL) 1.25 MG (50000 UNIT) CAPS capsule   glipiZIDE (GLUCOTROL) 5 MG tablet   Advised to hold Rybelsus on the day of surgery.  Isaiah Ruder, PA-C Surgical Short Stay/Anesthesiology [facility] Phone [phone] [facility] Phone [phone] 02/25/2024 5:02 PM

## 2024-02-28 ENCOUNTER — Ambulatory Visit (HOSPITAL_COMMUNITY)

## 2024-02-28 ENCOUNTER — Ambulatory Visit (HOSPITAL_COMMUNITY)
Admission: RE | Admit: 2024-02-28 | Discharge: 2024-02-28 | Disposition: A | Discharge status: Home / Self Care | Attending: Orthopaedic Surgery | Admitting: Orthopaedic Surgery

## 2024-02-28 ENCOUNTER — Ambulatory Visit (HOSPITAL_COMMUNITY): Payer: Self-pay | Admitting: Vascular Surgery

## 2024-02-28 ENCOUNTER — Encounter (HOSPITAL_COMMUNITY): Payer: Self-pay | Admitting: Orthopaedic Surgery

## 2024-02-28 ENCOUNTER — Other Ambulatory Visit: Payer: Self-pay

## 2024-02-28 ENCOUNTER — Encounter (HOSPITAL_COMMUNITY): Admission: RE | Disposition: A | Payer: Self-pay | Source: Home / Self Care | Attending: Orthopaedic Surgery

## 2024-02-28 DIAGNOSIS — F410 Panic disorder [episodic paroxysmal anxiety] without agoraphobia: Secondary | ICD-10-CM | POA: Diagnosis not present

## 2024-02-28 DIAGNOSIS — Z7984 Long term (current) use of oral hypoglycemic drugs: Secondary | ICD-10-CM | POA: Diagnosis not present

## 2024-02-28 DIAGNOSIS — F419 Anxiety disorder, unspecified: Secondary | ICD-10-CM

## 2024-02-28 DIAGNOSIS — S8251XA Displaced fracture of medial malleolus of right tibia, initial encounter for closed fracture: Secondary | ICD-10-CM

## 2024-02-28 DIAGNOSIS — J84112 Idiopathic pulmonary fibrosis: Secondary | ICD-10-CM | POA: Diagnosis not present

## 2024-02-28 DIAGNOSIS — Z79899 Other long term (current) drug therapy: Secondary | ICD-10-CM | POA: Diagnosis not present

## 2024-02-28 DIAGNOSIS — D631 Anemia in chronic kidney disease: Secondary | ICD-10-CM | POA: Insufficient documentation

## 2024-02-28 DIAGNOSIS — E119 Type 2 diabetes mellitus without complications: Secondary | ICD-10-CM | POA: Diagnosis not present

## 2024-02-28 DIAGNOSIS — I129 Hypertensive chronic kidney disease with stage 1 through stage 4 chronic kidney disease, or unspecified chronic kidney disease: Secondary | ICD-10-CM | POA: Diagnosis not present

## 2024-02-28 DIAGNOSIS — E1122 Type 2 diabetes mellitus with diabetic chronic kidney disease: Secondary | ICD-10-CM | POA: Insufficient documentation

## 2024-02-28 DIAGNOSIS — W19XXXA Unspecified fall, initial encounter: Secondary | ICD-10-CM | POA: Insufficient documentation

## 2024-02-28 DIAGNOSIS — Z9049 Acquired absence of other specified parts of digestive tract: Secondary | ICD-10-CM | POA: Diagnosis not present

## 2024-02-28 DIAGNOSIS — M9721XA Periprosthetic fracture around internal prosthetic right ankle joint, initial encounter: Secondary | ICD-10-CM | POA: Insufficient documentation

## 2024-02-28 DIAGNOSIS — N183 Chronic kidney disease, stage 3 unspecified: Secondary | ICD-10-CM | POA: Diagnosis not present

## 2024-02-28 DIAGNOSIS — G309 Alzheimer's disease, unspecified: Secondary | ICD-10-CM | POA: Insufficient documentation

## 2024-02-28 DIAGNOSIS — E890 Postprocedural hypothyroidism: Secondary | ICD-10-CM | POA: Diagnosis not present

## 2024-02-28 HISTORY — PX: ORIF ANKLE FRACTURE: SHX5408

## 2024-02-28 LAB — BASIC METABOLIC PANEL WITH GFR
Anion gap: 8 (ref 5–15)
BUN: 15 mg/dL (ref 8–23)
CO2: 27 mmol/L (ref 22–32)
Calcium: 9 mg/dL (ref 8.9–10.3)
Chloride: 106 mmol/L (ref 98–111)
Creatinine, Ser: 1.24 mg/dL — ABNORMAL HIGH (ref 0.44–1.00)
GFR, Estimated: 44 mL/min — ABNORMAL LOW
Glucose, Bld: 180 mg/dL — ABNORMAL HIGH (ref 70–99)
Potassium: 4 mmol/L (ref 3.5–5.1)
Sodium: 141 mmol/L (ref 135–145)

## 2024-02-28 LAB — GLUCOSE, CAPILLARY
Glucose-Capillary: 175 mg/dL — ABNORMAL HIGH (ref 70–99)
Glucose-Capillary: 221 mg/dL — ABNORMAL HIGH (ref 70–99)

## 2024-02-28 LAB — SURGICAL PCR SCREEN
MRSA, PCR: NEGATIVE
Staphylococcus aureus: NEGATIVE

## 2024-02-28 SURGERY — OPEN REDUCTION INTERNAL FIXATION (ORIF) ANKLE FRACTURE
Anesthesia: General | Site: Ankle | Laterality: Right

## 2024-02-28 MED ORDER — PHENYLEPHRINE 80 MCG/ML (10ML) SYRINGE FOR IV PUSH (FOR BLOOD PRESSURE SUPPORT)
PREFILLED_SYRINGE | INTRAVENOUS | Status: DC | PRN
  Administered 2024-02-28: 160 ug via INTRAVENOUS

## 2024-02-28 MED ORDER — ACETAMINOPHEN 500 MG PO TABS
1000.0000 mg | ORAL_TABLET | Freq: Once | ORAL | Status: AC
  Administered 2024-02-28: 1000 mg via ORAL
  Filled 2024-02-28: qty 2

## 2024-02-28 MED ORDER — ORAL CARE MOUTH RINSE: 15.0000 mL | Freq: Once | OROMUCOSAL | Status: AC

## 2024-02-28 MED ORDER — POVIDONE-IODINE 7.5 % EX SOLN
Freq: Once | CUTANEOUS | Status: AC
  Filled 2024-02-28: qty 118

## 2024-02-28 MED ORDER — BUPIVACAINE-EPINEPHRINE (PF) 0.5% -1:200000 IJ SOLN
INTRAMUSCULAR | Status: DC | PRN
  Administered 2024-02-28: 15 mL via PERINEURAL
  Administered 2024-02-28: 20 mL via PERINEURAL

## 2024-02-28 MED ORDER — LACTATED RINGERS IV SOLN: INTRAVENOUS | Status: DC

## 2024-02-28 MED ORDER — FENTANYL CITRATE (PF) 100 MCG/2ML IJ SOLN: 25.0000 ug | INTRAMUSCULAR | Status: DC | PRN

## 2024-02-28 MED ORDER — 0.9 % SODIUM CHLORIDE (POUR BTL) OPTIME
TOPICAL | Status: DC | PRN
  Administered 2024-02-28: 1000 mL

## 2024-02-28 MED ORDER — DEXMEDETOMIDINE HCL IN NACL 80 MCG/20ML IV SOLN
INTRAVENOUS | Status: DC | PRN
  Administered 2024-02-28: 10 ug via INTRAVENOUS

## 2024-02-28 MED ORDER — PHENYLEPHRINE HCL-NACL 20-0.9 MG/250ML-% IV SOLN
INTRAVENOUS | Status: DC | PRN
  Administered 2024-02-28: 30 ug/min via INTRAVENOUS

## 2024-02-28 MED ORDER — PROPOFOL 10 MG/ML IV BOLUS
INTRAVENOUS | Status: DC | PRN
  Administered 2024-02-28: 100 mg via INTRAVENOUS

## 2024-02-28 MED ORDER — SUGAMMADEX SODIUM 200 MG/2ML IV SOLN
INTRAVENOUS | Status: DC | PRN
  Administered 2024-02-28: 200 mg via INTRAVENOUS

## 2024-02-28 MED ORDER — POVIDONE-IODINE 10 % EX SWAB
2.0000 | Freq: Once | CUTANEOUS | Status: AC
  Administered 2024-02-28: 2 via TOPICAL

## 2024-02-28 MED ORDER — CEFAZOLIN SODIUM-DEXTROSE 2-4 GM/100ML-% IV SOLN
2.0000 g | INTRAVENOUS | Status: AC
  Administered 2024-02-28: 2 g via INTRAVENOUS
  Filled 2024-02-28: qty 100

## 2024-02-28 MED ORDER — TRAMADOL HCL 50 MG PO TABS: 50.0000 mg | ORAL_TABLET | Freq: Four times a day (QID) | ORAL | 0 refills | Status: AC | PRN

## 2024-02-28 MED ORDER — FENTANYL CITRATE (PF) 250 MCG/5ML IJ SOLN
INTRAMUSCULAR | Status: DC | PRN
  Administered 2024-02-28 (×2): 50 ug via INTRAVENOUS

## 2024-02-28 MED ORDER — FENTANYL CITRATE (PF) 100 MCG/2ML IJ SOLN
INTRAMUSCULAR | Status: AC
  Filled 2024-02-28: qty 2

## 2024-02-28 MED ORDER — TRAMADOL HCL 50 MG PO TABS: 50.0000 mg | ORAL_TABLET | Freq: Four times a day (QID) | ORAL | Status: DC

## 2024-02-28 MED ORDER — CHLORHEXIDINE GLUCONATE 0.12 % MT SOLN
15.0000 mL | Freq: Once | OROMUCOSAL | Status: AC
  Administered 2024-02-28: 15 mL via OROMUCOSAL
  Filled 2024-02-28: qty 15

## 2024-02-28 MED ORDER — PROPOFOL 500 MG/50ML IV EMUL
INTRAVENOUS | Status: DC | PRN
  Administered 2024-02-28: 150 ug/kg/min via INTRAVENOUS

## 2024-02-28 MED ORDER — ROCURONIUM BROMIDE 10 MG/ML (PF) SYRINGE
PREFILLED_SYRINGE | INTRAVENOUS | Status: DC | PRN
  Administered 2024-02-28: 40 mg via INTRAVENOUS

## 2024-02-28 MED ORDER — ONDANSETRON HCL 4 MG/2ML IJ SOLN
INTRAMUSCULAR | Status: DC | PRN
  Administered 2024-02-28: 4 mg via INTRAVENOUS

## 2024-02-28 MED ORDER — FENTANYL CITRATE (PF) 100 MCG/2ML IJ SOLN
INTRAMUSCULAR | Status: AC
  Administered 2024-02-28: 50 ug via INTRAVENOUS
  Filled 2024-02-28: qty 2

## 2024-02-28 SURGICAL SUPPLY — 52 items
ALCOHOL 70% 16 OZ (MISCELLANEOUS) ×1 IMPLANT
BAG COUNTER SPONGE SURGICOUNT (BAG) ×1 IMPLANT
BIT DRILL 2 LNG CALIBR (DRILL) IMPLANT
BIT DRILL 2.5 CANN STRL (BIT) IMPLANT
BLADE SURG 15 STRL LF DISP TIS (BLADE) ×1 IMPLANT
BNDG COHESIVE 4X5 TAN STRL LF (GAUZE/BANDAGES/DRESSINGS) IMPLANT
BNDG COHESIVE 6X5 TAN ST LF (GAUZE/BANDAGES/DRESSINGS) IMPLANT
BNDG COMPR ESMARK 6X3 LF (GAUZE/BANDAGES/DRESSINGS) IMPLANT
BNDG ELASTIC 6INX 5YD STR LF (GAUZE/BANDAGES/DRESSINGS) IMPLANT
BNDG ELASTIC 6X10 VLCR STRL LF (GAUZE/BANDAGES/DRESSINGS) ×1 IMPLANT
CANISTER SUCTION 3000ML PPV (SUCTIONS) ×1 IMPLANT
CHLORAPREP W/TINT 26 (MISCELLANEOUS) ×2 IMPLANT
COVER SURGICAL LIGHT HANDLE (MISCELLANEOUS) ×1 IMPLANT
CUFF TOURN SGL QUICK 42 (TOURNIQUET CUFF) IMPLANT
CUFF TRNQT CYL 34X4.125X (TOURNIQUET CUFF) ×1 IMPLANT
DRAPE OEC MINIVIEW 54X84 (DRAPES) ×1 IMPLANT
DRAPE U-SHAPE 47X51 STRL (DRAPES) ×1 IMPLANT
DRSG MEPITEL 4X7.2 (GAUZE/BANDAGES/DRESSINGS) ×1 IMPLANT
DRSG XEROFORM 1X8 (GAUZE/BANDAGES/DRESSINGS) ×1 IMPLANT
ELECTRODE REM PT RTRN 9FT ADLT (ELECTROSURGICAL) ×1 IMPLANT
GAUZE PAD ABD 8X10 STRL (GAUZE/BANDAGES/DRESSINGS) ×2 IMPLANT
GAUZE SPONGE 4X4 12PLY STRL (GAUZE/BANDAGES/DRESSINGS) IMPLANT
GAUZE SPONGE 4X4 12PLY STRL LF (GAUZE/BANDAGES/DRESSINGS) ×1 IMPLANT
GLOVE BIOGEL M STRL SZ7.5 (GLOVE) ×1 IMPLANT
GLOVE BIOGEL PI IND STRL 8 (GLOVE) ×1 IMPLANT
GLOVE SRG 8 PF TXTR STRL LF DI (GLOVE) ×1 IMPLANT
GLOVE SURG ENC TEXT LTX SZ7.5 (GLOVE) ×1 IMPLANT
GOWN STRL REUS W/ TWL LRG LVL3 (GOWN DISPOSABLE) ×1 IMPLANT
GOWN STRL REUS W/ TWL XL LVL3 (GOWN DISPOSABLE) ×2 IMPLANT
GUIDEWIRE ORTH 157X1.6XTROC (WIRE) IMPLANT
KIT BASIN OR (CUSTOM PROCEDURE TRAY) ×1 IMPLANT
KIT TURNOVER KIT B (KITS) ×1 IMPLANT
PACK ORTHO EXTREMITY (CUSTOM PROCEDURE TRAY) ×1 IMPLANT
PAD ARMBOARD POSITIONER FOAM (MISCELLANEOUS) ×2 IMPLANT
PAD CAST 4YDX4 CTTN HI CHSV (CAST SUPPLIES) ×1 IMPLANT
PLATE SHEER VERTICAL 4H (Plate) IMPLANT
SCREW CORT TI FT 3.5X38 (Screw) IMPLANT
SCREW KREULOCK 3X28 (Screw) IMPLANT
SCREW LOCK COMP 3X16 (Screw) IMPLANT
SCREW LP TIT 3.5X34 (Screw) IMPLANT
SCREW LP TIT 3.5X36 (Screw) IMPLANT
SOLN 0.9% NACL POUR BTL 1000ML (IV SOLUTION) ×1 IMPLANT
SPLINT PLASTER CAST XFAST 5X30 (CAST SUPPLIES) IMPLANT
SPONGE T-LAP 18X18 ~~LOC~~+RFID (SPONGE) ×1 IMPLANT
STAPLER SKIN PROX 35W (STAPLE) IMPLANT
SUCTION TUBE FRAZIER 10FR DISP (SUCTIONS) ×1 IMPLANT
SUT ETHILON 3 0 PS 1 (SUTURE) ×1 IMPLANT
SUT MNCRL AB 3-0 PS2 27 (SUTURE) ×1 IMPLANT
SUT VIC AB 2-0 CT1 TAPERPNT 27 (SUTURE) ×2 IMPLANT
TOWEL GREEN STERILE (TOWEL DISPOSABLE) ×1 IMPLANT
TOWEL GREEN STERILE FF (TOWEL DISPOSABLE) ×1 IMPLANT
TUBE CONNECTING 12X1/4 (SUCTIONS) ×1 IMPLANT

## 2024-02-28 NOTE — Anesthesia Procedure Notes (Signed)
 Anesthesia Regional Block: Adductor canal block   Pre-Anesthetic Checklist: , timeout performed,  Correct Patient, Correct Site, Correct Laterality,  Correct Procedure, Correct Position, site marked,  Risks and benefits discussed,  Pre-op evaluation,  At surgeon's request and post-op pain management  Laterality: Right  Prep: Maximum Sterile Barrier Precautions used, chloraprep       Needles:  Injection technique: Single-shot  Needle Type: Echogenic Stimulator Needle     Needle Length: 9cm  Needle Gauge: 21     Additional Needles:   Procedures:,,,, ultrasound used (permanent image in chart),,    Narrative:  Start time: 02/28/2024 8:52 AM End time: 02/28/2024 9:02 AM Injection made incrementally with aspirations every 5 mL.  Performed by: Personally  Anesthesiologist: Epifanio Fallow, MD  Additional Notes:

## 2024-02-28 NOTE — Transfer of Care (Signed)
 Immediate Anesthesia Transfer of Care Note  Patient: Shannon Ballard  Procedure(s) Performed: OPEN REDUCTION INTERNAL FIXATION (ORIF) ANKLE FRACTURE (Right: Ankle)  Patient Location: PACU  Anesthesia Type:General  Level of Consciousness: drowsy, patient cooperative, and responds to stimulation  Airway & Oxygen Therapy: Patient Spontanous Breathing and Patient connected to nasal cannula oxygen  Post-op Assessment: Report given to RN, Post -op Vital signs reviewed and stable, and Patient moving all extremities X 4  Post vital signs: Reviewed and stable  Last Vitals:  Vitals Value Taken Time  BP 131/56 02/28/24 10:25  Temp 36.7 C 02/28/24 10:25  Pulse 62 02/28/24 10:26  Resp 12 02/28/24 10:26  SpO2 100 % 02/28/24 10:26  Vitals shown include unfiled device data.  Last Pain:  Vitals:   02/28/24 0900  TempSrc:   PainSc: 0-No pain         Complications: There were no known notable events for this encounter.

## 2024-02-28 NOTE — Anesthesia Procedure Notes (Signed)
 Anesthesia Regional Block: Popliteal block   Pre-Anesthetic Checklist: , timeout performed,  Correct Patient, Correct Site, Correct Laterality,  Correct Procedure, Correct Position, site marked,  Risks and benefits discussed,  Pre-op evaluation,  At surgeon's request and post-op pain management  Laterality: Right  Prep: Maximum Sterile Barrier Precautions used, chloraprep       Needles:  Injection technique: Single-shot  Needle Type: Echogenic Stimulator Needle     Needle Length: 9cm  Needle Gauge: 21     Additional Needles:   Procedures:,,,, ultrasound used (permanent image in chart),,    Narrative:  Start time: 02/28/2024 9:02 AM End time: 02/28/2024 9:07 AM Injection made incrementally with aspirations every 5 mL. Anesthesiologist: Epifanio Fallow, MD

## 2024-02-28 NOTE — Anesthesia Postprocedure Evaluation (Signed)
"   Anesthesia Post Note  Patient: Shannon Ballard  Procedure(s) Performed: OPEN REDUCTION INTERNAL FIXATION (ORIF) ANKLE FRACTURE (Right: Ankle)     Patient location during evaluation: PACU Anesthesia Type: General and Regional Level of consciousness: awake and alert Pain management: pain level controlled Vital Signs Assessment: post-procedure vital signs reviewed and stable Respiratory status: spontaneous breathing, nonlabored ventilation and respiratory function stable Cardiovascular status: blood pressure returned to baseline and stable Postop Assessment: no apparent nausea or vomiting Anesthetic complications: no   There were no known notable events for this encounter.  Last Vitals:  Vitals:   02/28/24 1045 02/28/24 1100  BP: 107/60 (!) 124/50  Pulse: 62 (!) 58  Resp: 15 10  Temp:  36.8 C  SpO2: 98% 97%    Last Pain:  Vitals:   02/28/24 1045  TempSrc:   PainSc: 0-No pain                 Benno Brensinger,W. EDMOND      "

## 2024-02-28 NOTE — Discharge Instructions (Signed)
 DR. ELSA FOOT & ANKLE SURGERY POST-OP INSTRUCTIONS   Pain Management The numbing medicine and your leg will last around 18 hours, take a dose of your pain medicine as soon as you feel it wearing off to avoid rebound pain. Keep your foot elevated above heart level.  Make sure that your heel hangs free ('floats'). Take all prescribed medication as directed. If taking narcotic pain medication you may want to use an over-the-counter stool softener to avoid constipation. You may take over-the-counter NSAIDs (ibuprofen, naproxen, etc.) as well as over-the-counter acetaminophen  as directed on the packaging as a supplement for your pain and may also use it to wean away from the prescription medication.  Activity Non-weightbearing Keep splint intact  First Postoperative Visit Your first postop visit will be at least 2 weeks after surgery.  This should be scheduled when you schedule surgery. If you do not have a postoperative visit scheduled please call [phone] to schedule an appointment. At the appointment your incision will be evaluated for suture removal, x-rays will be obtained if necessary.  General Instructions Swelling is very common after foot and ankle surgery.  It often takes 3 months for the foot and ankle to begin to feel comfortable.  Some amount of swelling will persist for 6-12 months. DO NOT change the dressing.  If there is a problem with the dressing (too tight, loose, gets wet, etc.) please contact Dr. Isiah office. DO NOT get the dressing wet.  For showers you can use an over-the-counter cast cover or wrap a washcloth around the top of your dressing and then cover it with a plastic bag and tape it to your leg. DO NOT soak the incision (no tubs, pools, bath, etc.) until you have approval from Dr. ELSA.  Contact Dr. Nadara office or go to Emergency Room if: Temperature above 101 F. Increasing pain that is unresponsive to pain medication or elevation Excessive redness or  swelling in your foot Dressing problems - excessive bloody drainage, looseness or tightness, or if dressing gets wet Develop pain, swelling, warmth, or discoloration of your calf

## 2024-02-28 NOTE — Anesthesia Procedure Notes (Signed)
 Procedure Name: Intubation Date/Time: 02/28/2024 9:29 AM  Performed by: Chaney Ozell CROME, CRNAPre-anesthesia Checklist: Patient identified, Emergency Drugs available, Suction available and Patient being monitored Patient Re-evaluated:Patient Re-evaluated prior to induction Oxygen Delivery Method: Circle System Utilized Preoxygenation: Pre-oxygenation with 100% oxygen Induction Type: IV induction Ventilation: Mask ventilation without difficulty Laryngoscope Size: Mac and 3 Grade View: Grade II Tube type: Oral Tube size: 7.5 mm Number of attempts: 1 Airway Equipment and Method: Stylet and Oral airway Placement Confirmation: ETT inserted through vocal cords under direct vision, positive ETCO2 and breath sounds checked- equal and bilateral Secured at: 22 cm Tube secured with: Tape Dental Injury: Teeth and Oropharynx as per pre-operative assessment

## 2024-02-28 NOTE — H&P (Signed)
 PREOPERATIVE H&P  Chief Complaint: Right ankle pain  HPI: Shannon Ballard is a 78 y.o. female who presents for preoperative history and physical with a diagnosis of right displaced medial malleolus fracture after a fall.  She is here today for surgery for her fracture. Symptoms are rated as moderate to severe, and have been worsening.  This is significantly impairing activities of daily living.  She has elected for surgical management.   Past Medical History:  Diagnosis Date   Alzheimer's disease (HCC)    Anemia    CKD (chronic kidney disease)    stage 3   Diabetes mellitus without complication (HCC)    Heart murmur    Hypertension    IPF (idiopathic pulmonary fibrosis) (HCC)    mild (pulmonologist Dr. Nathanael)   Panic attack    Thyroid  disease    Past Surgical History:  Procedure Laterality Date   CHOLECYSTECTOMY     THYROIDECTOMY     Social History   Socioeconomic History   Marital status: Married    Spouse name: Not on file   Number of children: Not on file   Years of education: Not on file   Highest education level: Not on file  Occupational History   Not on file  Tobacco Use   Smoking status: Never   Smokeless tobacco: Never  Vaping Use   Vaping status: Never Used  Substance and Sexual Activity   Alcohol use: No    Alcohol/week: 0.0 standard drinks of alcohol   Drug use: No   Sexual activity: Not on file  Other Topics Concern   Not on file  Social History Narrative   Not on file   Social Drivers of Health   Tobacco Use: Low Risk (02/28/2024)   Patient History    Smoking Tobacco Use: Never    Smokeless Tobacco Use: Never    Passive Exposure: Not on file  Financial Resource Strain: Not on file  Food Insecurity: Unknown (10/11/2023)   Received from [facility]   Epic    Within the past 12 months, you worried that your food would run out before you got money to buy more: Patient unable to answer    Within the past 12 months, the food you bought just didn't  last and you didn't have money to get more. : Patient unable to answer  Transportation Needs: Unmet Transportation Needs (09/22/2023)   Received from [facility]    In the past 12 months, has lack of reliable transportation kept you from medical appointments, meetings, work or from getting things needed for daily living? : Yes  Physical Activity: Not on file  Stress: Not on file  Social Connections: Not on file  Depression (EYV7-0): Not on file  Alcohol Screen: Not on file  Housing: Unknown (10/11/2023)   Received from [facility]   Epic    What is your living situation today?: Patient unable to answer    Think about the place you live. Do you have problems with any of the following? Choose all that apply:: Patient unable to answer  Utilities: Unknown (10/11/2023)   Received from [facility]   Utilities    In the past 12 months has the electric, gas, oil, or water company threatened to shut off services in your home? : Patient unable to answer  Health Literacy: Not on file   History reviewed. No pertinent family history. Allergies[1] Prior to Admission medications  Medication Sig Start Date End Date Taking? Authorizing Provider  acetaminophen  (TYLENOL ) 650 MG CR tablet Take 500-650 mg by mouth every 8 (eight) hours as needed for pain.   Yes [provider]  amLODipine (NORVASC) 5 MG tablet Take 5 mg by mouth daily.   Yes [provider]  aspirin 81 MG tablet Take 81 mg by mouth daily.   Yes [provider]  atorvastatin (LIPITOR) 40 MG tablet Take 40 mg by mouth at bedtime.   Yes [provider]  Cyanocobalamin (VITAMIN B-12 PO) Take 2,000 mcg by mouth daily.   Yes [provider]  escitalopram (LEXAPRO) 20 MG tablet Take 20 mg by mouth daily.   Yes [provider]  ferrous sulfate 325 (65 FE) MG EC tablet Take 325 mg by mouth daily with breakfast. 04/22/22  Yes [provider]  fluticasone  furoate-vilanterol (BREO ELLIPTA) 100-25 MCG/ACT AEPB Inhale 1 puff into the lungs daily. 01/03/21  Yes [provider]  glipiZIDE (GLUCOTROL) 5 MG tablet  02/16/24  Yes [provider]  levothyroxine (SYNTHROID, LEVOTHROID) 137 MCG tablet Take 68.5-137 mcg by mouth See admin instructions. Take 68.5 mcg Monday, Tuesday and Wednesday, all the other days take 137 mcg before breakfast   Yes [provider]  losartan (COZAAR) 25 MG tablet Take 50 mg by mouth daily.   Yes [provider]  memantine (NAMENDA) 10 MG tablet Take 10 mg by mouth 2 (two) times daily.   Yes [provider]  MYRBETRIQ 25 MG TB24 tablet Take 25 mg by mouth at bedtime. 01/22/22  Yes [provider]  pioglitazone (ACTOS) 30 MG tablet Take 30 mg by mouth daily.   Yes [provider]  rivastigmine (EXELON) 4.5 MG capsule Take 4.5 mg by mouth 2 (two) times daily. 02/09/22  Yes [provider]  RYBELSUS 7 MG TABS Take 7 mg by mouth daily. 07/20/21  Yes [provider]  Vitamin D, Ergocalciferol, (DRISDOL) 1.25 MG (50000 UNIT) CAPS capsule Take 50,000 Units by mouth every 7 (seven) days. 11/04/23  Yes [provider]  fluticasone (FLONASE) 50 MCG/ACT nasal spray Place into both nostrils daily. Patient not taking: Reported on 02/25/2024    [provider]     Positive ROS: All other systems have been reviewed and were otherwise negative with the exception of those mentioned in the HPI and as above.  Physical Exam:  Vitals:   02/28/24 0731  BP: (!) (P) 154/70  Pulse: (!) (P) 58  Resp: (P) 18  Temp: (P) 98.9 F (37.2 C)  SpO2: (P) 100%   General: Alert, no acute distress Cardiovascular: No pedal edema Respiratory: No cyanosis, no use of accessory musculature GI: No organomegaly, abdomen is soft and non-tender Skin: No lesions in the area of chief complaint Neurologic: Sensation intact distally Psychiatric: Patient is competent  for consent with normal mood and affect Lymphatic: No axillary or cervical lymphadenopathy  MUSCULOSKELETAL: Right lower extremity in a short leg splint.  Splint is in somewhat disarray.  Exposed forefoot warm and well-perfused without significant swelling or tenderness.  No tenderness proximal to the splint.  Splint is well aligned.  Assessment: Right displaced medial malleolus fracture here today for open treatment   Plan: Plan for open treatment of her right sided medial malleolus fracture.  She will be discharged from the PACU..  We discussed the risks, benefits and alternatives of surgery which include but are not limited to wound healing complications, infection, nonunion, malunion, need for further surgery, damage to surrounding structures and continued pain.  They understand there is no guarantees to an acceptable outcome.  After weighing these risks they opted to proceed with surgery.     Lonni JONELLE Pae, MD    02/28/2024 8:07 AM      [1]  Allergies Allergen Reactions   Celebrex [Celecoxib] Other (See Comments)    Kidney   Donepezil Other (See Comments)    Could not sleep   Lisinopril Swelling   Meloxicam     Kidney    Penicillins Other (See Comments)    Unknown

## 2024-02-28 NOTE — Op Note (Signed)
" °  Shannon Ballard female 78 y.o. 02/28/2024  PreOperative Diagnosis: Right displaced medial malleolus fracture  PostOperative Diagnosis: Same  PROCEDURE: Open reduction internal fixation of right displaced medial malleolus fracture  SURGEON: Lonni Pae, MD  ASSISTANT: None  ANESTHESIA: General With peripheral nerve block  FINDINGS: See below  IMPLANTS: Arthrex vertical shear locking plate  [id number]:78 y.o. female sustained the above injury.  She had displaced medial malleolus fracture and was indicated for surgery due to the nature of the fracture and propensity for nonunion at this site without surgery.   Patient understood the risks, benefits and alternatives to surgery which include but are not limited to wound healing complications, infection, nonunion, malunion, need for further surgery as well as damage to surrounding structures. They also understood the potential for continued pain in that there were no guarantees of acceptable outcome After weighing these risks the patient opted to proceed with surgery.  PROCEDURE: Patient was identified in the preoperative holding area.  The right leg was marked by myself.  Consent was signed by myself and the patient.  Block was performed by anesthesia in the preoperative holding area.  Patient was taken to the operative suite and placed supine on the operative table.  General anesthesia was induced without difficulty. Bump was placed under the operative hip and bone foam was used.  All bony prominences were well padded.  Tourniquet was placed on the operative thigh.  Preoperative antibiotics were given. The extremity was prepped and draped in the usual sterile fashion and surgical timeout was performed.  The limb was elevated and the tourniquet was inflated to 250 mmHg.  I began by marking out the incision on the medial side of the ankle.  Then an incision was carried sharply through skin and subcutaneous tissue overlying the medial  malleolus down to the bone.  Blunt dissection was used to mobilize skin flaps.  Then subperiosteal dissection was carried out of the fracture site.  The fracture was identified mobilized.  The fracture was cleaned with a rondure and a curette.  Then it was reduced under direct visualization and held provisionally with a K wire.  Then fluoroscopy confirmed acceptable reduction at the joint line of the fracture.  Then a medial malleolar locking plate was placed along the area due to the vertical shear fashion of the fracture.  A combination of locking and nonlocking screws were used to stabilize the fracture.  The fracture was well reduced and there was good fixation.  Fluoroscopy confirmed acceptable position of the plate and the screw lengths.  Final fluoroscopic images were obtained.  The wound was irrigated with normal saline.  Was then closed in layered fashion using 3-0 Monocryl and staples.  Soft dressing was placed.  Tourniquet was released.  Short leg splint was placed.  She was then awakened from anesthesia and taken recovery in stable condition.  No complications.  POST OPERATIVE INSTRUCTIONS: Touchdown weightbearing operative extremity Keep splint intact and dry Follow-up in 2 weeks for splint removal, suture removal and nonweightbearing x-rays. Aspirin for DVT prophylaxis  TOURNIQUET TIME: Less than 1 hour  BLOOD LOSS:  Minimal         DRAINS: none         SPECIMEN: none       COMPLICATIONS:  * No complications entered in OR log *         Disposition: PACU - hemodynamically stable.         Condition: stable  "

## 2024-02-29 ENCOUNTER — Encounter (HOSPITAL_COMMUNITY): Payer: Self-pay | Admitting: Orthopaedic Surgery
